# Patient Record
Sex: Male | Born: 1960
Health system: Southern US, Community
[De-identification: ages and names within clinical notes are randomized; demographics above are authoritative.]

## PROBLEM LIST (undated history)

## (undated) DIAGNOSIS — I10 Essential (primary) hypertension: Secondary | ICD-10-CM

## (undated) DIAGNOSIS — N509 Disorder of male genital organs, unspecified: Secondary | ICD-10-CM

## (undated) DIAGNOSIS — Z8739 Personal history of other diseases of the musculoskeletal system and connective tissue: Secondary | ICD-10-CM

## (undated) DIAGNOSIS — E785 Hyperlipidemia, unspecified: Secondary | ICD-10-CM

## (undated) HISTORY — PX: TOTAL HIP ARTHROPLASTY: SHX124

---

## 1997-10-12 ENCOUNTER — Emergency Department (HOSPITAL_COMMUNITY): Admission: EM | Admit: 1997-10-12 | Discharge: 1997-10-12 | Payer: Self-pay | Admitting: Emergency Medicine

## 1998-05-08 HISTORY — PX: KNEE ARTHROSCOPY: SUR90

## 2000-05-08 HISTORY — PX: WRIST GANGLION EXCISION: SHX840

## 2003-05-19 ENCOUNTER — Ambulatory Visit (HOSPITAL_COMMUNITY): Admission: RE | Admit: 2003-05-19 | Discharge: 2003-05-19 | Payer: Self-pay | Admitting: General Surgery

## 2003-05-19 ENCOUNTER — Encounter (INDEPENDENT_AMBULATORY_CARE_PROVIDER_SITE_OTHER): Payer: Self-pay | Admitting: *Deleted

## 2003-05-19 ENCOUNTER — Ambulatory Visit (HOSPITAL_BASED_OUTPATIENT_CLINIC_OR_DEPARTMENT_OTHER): Admission: RE | Admit: 2003-05-19 | Discharge: 2003-05-19 | Payer: Self-pay | Admitting: General Surgery

## 2003-05-19 HISTORY — PX: OTHER SURGICAL HISTORY: SHX169

## 2004-04-19 ENCOUNTER — Ambulatory Visit: Payer: Self-pay | Admitting: Internal Medicine

## 2004-05-17 ENCOUNTER — Ambulatory Visit: Payer: Self-pay | Admitting: Internal Medicine

## 2004-05-18 ENCOUNTER — Ambulatory Visit: Payer: Self-pay | Admitting: Internal Medicine

## 2004-05-26 ENCOUNTER — Ambulatory Visit: Payer: Self-pay | Admitting: Internal Medicine

## 2004-06-13 ENCOUNTER — Ambulatory Visit (HOSPITAL_COMMUNITY): Admission: RE | Admit: 2004-06-13 | Discharge: 2004-06-13 | Payer: Self-pay | Admitting: Internal Medicine

## 2005-01-03 ENCOUNTER — Ambulatory Visit: Payer: Self-pay | Admitting: Internal Medicine

## 2005-06-28 ENCOUNTER — Ambulatory Visit: Payer: Self-pay | Admitting: Internal Medicine

## 2005-11-22 ENCOUNTER — Ambulatory Visit: Payer: Self-pay | Admitting: Internal Medicine

## 2006-03-13 ENCOUNTER — Ambulatory Visit: Payer: Self-pay | Admitting: Internal Medicine

## 2006-03-27 ENCOUNTER — Ambulatory Visit: Payer: Self-pay | Admitting: Internal Medicine

## 2006-08-24 ENCOUNTER — Ambulatory Visit: Payer: Self-pay | Admitting: Internal Medicine

## 2006-08-24 LAB — CONVERTED CEMR LAB
ALT: 39 units/L (ref 0–40)
AST: 25 units/L (ref 0–37)
Basophils Relative: 0.5 % (ref 0.0–1.0)
Bilirubin, Direct: 0.1 mg/dL (ref 0.0–0.3)
CO2: 30 meq/L (ref 19–32)
Calcium: 9.6 mg/dL (ref 8.4–10.5)
Chloride: 110 meq/L (ref 96–112)
Cholesterol: 149 mg/dL (ref 0–200)
Eosinophils Absolute: 0.1 10*3/uL (ref 0.0–0.6)
Eosinophils Relative: 2.4 % (ref 0.0–5.0)
Glucose, Bld: 105 mg/dL — ABNORMAL HIGH (ref 70–99)
HCT: 45.9 % (ref 39.0–52.0)
Lymphocytes Relative: 42.9 % (ref 12.0–46.0)
MCV: 91.1 fL (ref 78.0–100.0)
Neutro Abs: 2.7 10*3/uL (ref 1.4–7.7)
Neutrophils Relative %: 46.3 % (ref 43.0–77.0)
Nitrite: NEGATIVE
PSA: 1.02 ng/mL (ref 0.10–4.00)
Platelets: 204 10*3/uL (ref 150–400)
RBC: 5.04 M/uL (ref 4.22–5.81)
Sodium: 146 meq/L — ABNORMAL HIGH (ref 135–145)
Specific Gravity, Urine: 1.01 (ref 1.000–1.03)
Total Bilirubin: 0.8 mg/dL (ref 0.3–1.2)
Total Protein, Urine: NEGATIVE mg/dL
Total Protein: 7.5 g/dL (ref 6.0–8.3)
Urine Glucose: NEGATIVE mg/dL
Urobilinogen, UA: 0.2 (ref 0.0–1.0)
WBC: 5.7 10*3/uL (ref 4.5–10.5)

## 2006-08-29 ENCOUNTER — Ambulatory Visit: Payer: Self-pay | Admitting: Internal Medicine

## 2006-12-27 DIAGNOSIS — E669 Obesity, unspecified: Secondary | ICD-10-CM | POA: Insufficient documentation

## 2006-12-27 DIAGNOSIS — M545 Low back pain, unspecified: Secondary | ICD-10-CM | POA: Insufficient documentation

## 2006-12-27 DIAGNOSIS — I10 Essential (primary) hypertension: Secondary | ICD-10-CM | POA: Insufficient documentation

## 2006-12-27 DIAGNOSIS — E785 Hyperlipidemia, unspecified: Secondary | ICD-10-CM | POA: Insufficient documentation

## 2006-12-27 DIAGNOSIS — J309 Allergic rhinitis, unspecified: Secondary | ICD-10-CM | POA: Insufficient documentation

## 2006-12-27 DIAGNOSIS — Z872 Personal history of diseases of the skin and subcutaneous tissue: Secondary | ICD-10-CM | POA: Insufficient documentation

## 2006-12-27 DIAGNOSIS — F528 Other sexual dysfunction not due to a substance or known physiological condition: Secondary | ICD-10-CM | POA: Insufficient documentation

## 2007-11-28 ENCOUNTER — Ambulatory Visit: Payer: Self-pay | Admitting: Internal Medicine

## 2007-11-29 LAB — CONVERTED CEMR LAB
ALT: 29 units/L (ref 0–53)
Albumin: 3.9 g/dL (ref 3.5–5.2)
Alkaline Phosphatase: 68 units/L (ref 39–117)
BUN: 11 mg/dL (ref 6–23)
Bilirubin Urine: NEGATIVE
Bilirubin, Direct: 0.1 mg/dL (ref 0.0–0.3)
CO2: 29 meq/L (ref 19–32)
Calcium: 9.4 mg/dL (ref 8.4–10.5)
Eosinophils Relative: 2.3 % (ref 0.0–5.0)
Glucose, Bld: 93 mg/dL (ref 70–99)
Hemoglobin: 15.5 g/dL (ref 13.0–17.0)
Leukocytes, UA: NEGATIVE
Lymphocytes Relative: 46.9 % — ABNORMAL HIGH (ref 12.0–46.0)
Monocytes Relative: 8.8 % (ref 3.0–12.0)
Neutro Abs: 2.1 10*3/uL (ref 1.4–7.7)
Nitrite: NEGATIVE
PSA: 1.25 ng/mL (ref 0.10–4.00)
RBC: 5.05 M/uL (ref 4.22–5.81)
RDW: 13.3 % (ref 11.5–14.6)
Sodium: 140 meq/L (ref 135–145)
Specific Gravity, Urine: 1.01 (ref 1.000–1.03)
Total CHOL/HDL Ratio: 4.6
Total Protein, Urine: NEGATIVE mg/dL
Total Protein: 7.3 g/dL (ref 6.0–8.3)
WBC: 5.1 10*3/uL (ref 4.5–10.5)
pH: 6.5 (ref 5.0–8.0)

## 2007-12-01 ENCOUNTER — Encounter: Payer: Self-pay | Admitting: Internal Medicine

## 2007-12-06 ENCOUNTER — Encounter: Payer: Self-pay | Admitting: Internal Medicine

## 2008-01-16 ENCOUNTER — Ambulatory Visit (HOSPITAL_COMMUNITY): Admission: RE | Admit: 2008-01-16 | Discharge: 2008-01-16 | Payer: Self-pay | Admitting: Orthopedic Surgery

## 2008-06-10 ENCOUNTER — Ambulatory Visit: Payer: Self-pay | Admitting: Internal Medicine

## 2008-06-10 DIAGNOSIS — R1084 Generalized abdominal pain: Secondary | ICD-10-CM | POA: Insufficient documentation

## 2008-11-18 ENCOUNTER — Ambulatory Visit: Payer: Self-pay | Admitting: Internal Medicine

## 2009-02-26 ENCOUNTER — Telehealth: Payer: Self-pay | Admitting: Internal Medicine

## 2009-03-29 ENCOUNTER — Ambulatory Visit: Payer: Self-pay | Admitting: Internal Medicine

## 2009-04-16 ENCOUNTER — Ambulatory Visit: Payer: Self-pay | Admitting: Internal Medicine

## 2009-04-16 DIAGNOSIS — M25569 Pain in unspecified knee: Secondary | ICD-10-CM | POA: Insufficient documentation

## 2009-04-16 DIAGNOSIS — J019 Acute sinusitis, unspecified: Secondary | ICD-10-CM | POA: Insufficient documentation

## 2009-04-26 ENCOUNTER — Ambulatory Visit: Payer: Self-pay | Admitting: Internal Medicine

## 2009-04-26 ENCOUNTER — Telehealth: Payer: Self-pay | Admitting: Internal Medicine

## 2009-04-26 DIAGNOSIS — J069 Acute upper respiratory infection, unspecified: Secondary | ICD-10-CM | POA: Insufficient documentation

## 2010-01-17 ENCOUNTER — Telehealth: Payer: Self-pay | Admitting: Internal Medicine

## 2010-02-09 ENCOUNTER — Ambulatory Visit: Payer: Self-pay | Admitting: Internal Medicine

## 2010-02-09 ENCOUNTER — Encounter: Payer: Self-pay | Admitting: Internal Medicine

## 2010-02-09 LAB — CONVERTED CEMR LAB
ALT: 71 units/L — ABNORMAL HIGH (ref 0–53)
AST: 40 units/L — ABNORMAL HIGH (ref 0–37)
Alkaline Phosphatase: 72 units/L (ref 39–117)
BUN: 14 mg/dL (ref 6–23)
Basophils Relative: 0.8 % (ref 0.0–3.0)
Bilirubin Urine: NEGATIVE
Bilirubin, Direct: 0.1 mg/dL (ref 0.0–0.3)
Chloride: 107 meq/L (ref 96–112)
Creatinine, Ser: 1.1 mg/dL (ref 0.4–1.5)
Eosinophils Relative: 4.4 % (ref 0.0–5.0)
GFR calc non Af Amer: 91.42 mL/min (ref >60)
Hemoglobin, Urine: NEGATIVE
LDL Cholesterol: 95 mg/dL (ref 0–99)
Monocytes Relative: 8.4 % (ref 3.0–12.0)
Neutrophils Relative %: 41.7 % — ABNORMAL LOW (ref 43.0–77.0)
Nitrite: NEGATIVE
Platelets: 200 10*3/uL (ref 150.0–400.0)
Potassium: 4 meq/L (ref 3.5–5.1)
RBC: 4.6 M/uL (ref 4.22–5.81)
TSH: 0.99 microintl units/mL (ref 0.35–5.50)
Total Bilirubin: 0.6 mg/dL (ref 0.3–1.2)
Total CHOL/HDL Ratio: 3
Total Protein, Urine: NEGATIVE mg/dL
Total Protein: 7.4 g/dL (ref 6.0–8.3)
Urobilinogen, UA: 0.2 (ref 0.0–1.0)
VLDL: 13.4 mg/dL (ref 0.0–40.0)
WBC: 6.7 10*3/uL (ref 4.5–10.5)

## 2010-05-28 ENCOUNTER — Encounter: Payer: Self-pay | Admitting: Internal Medicine

## 2010-06-05 LAB — CONVERTED CEMR LAB
BUN: 15 mg/dL (ref 6–23)
Basophils Absolute: 0.1 10*3/uL (ref 0.0–0.1)
Bilirubin, Direct: 0.1 mg/dL (ref 0.0–0.3)
CO2: 31 meq/L (ref 19–32)
Chloride: 108 meq/L (ref 96–112)
Creatinine, Ser: 1 mg/dL (ref 0.4–1.5)
Eosinophils Absolute: 0.2 10*3/uL (ref 0.0–0.7)
HDL: 50.3 mg/dL (ref >39.00)
Hemoglobin, Urine: NEGATIVE
LDL Cholesterol: 76 mg/dL (ref 0–99)
Leukocytes, UA: NEGATIVE
MCHC: 34.2 g/dL (ref 30.0–36.0)
MCV: 90.7 fL (ref 78.0–100.0)
Monocytes Absolute: 0.5 10*3/uL (ref 0.1–1.0)
Neutrophils Relative %: 46 % (ref 43.0–77.0)
Nitrite: NEGATIVE
Platelets: 179 10*3/uL (ref 150.0–400.0)
Total Bilirubin: 0.8 mg/dL (ref 0.3–1.2)
Total CHOL/HDL Ratio: 3
Total Protein, Urine: NEGATIVE mg/dL
Total Protein: 7.3 g/dL (ref 6.0–8.3)
Triglycerides: 49 mg/dL (ref 0.0–149.0)
Urine Glucose: NEGATIVE mg/dL

## 2010-06-07 NOTE — Progress Notes (Signed)
  Phone Note Refill Request Message from:  Fax from Pharmacy on January 17, 2010 2:25 PM  Refills Requested: Medication #1:  CIALIS 20 MG TABS 1 by mouth once daily as needed   Dosage confirmed as above?Dosage Confirmed   Last Refilled: 11/28/2007   [facility] [address] Initial call taken by: Zella Ball Ewing CMA Duncan Dull),  January 17, 2010 2:26 PM    Prescriptions: CIALIS 20 MG TABS (TADALAFIL) 1 by mouth once daily as needed  #5 x 2   Entered by:   Zella Ball Ewing CMA (AAMA)   Authorized by:   Corwin Levins MD   Signed by:   Scharlene Gloss CMA (AAMA) on 01/17/2010   Method used:   [facility]ed to ...       [facility] [place] [phone]* (retail)       [phone] [address]       [place], [place]  [address]       Ph: [phone] or [phone]       Fax: [phone]   RxID:   [id number]

## 2010-06-07 NOTE — Assessment & Plan Note (Signed)
Summary: COUGH/ NOT FEELING WELL/ NO FEVER/NWS   Vital Signs:  Patient profile:   50 year old male Height:      69 inches Weight:      231.75 pounds BMI:     34.35 O2 Sat:      97 % on Room air Temp:     98.4 degrees F oral Pulse rate:   74 / minute BP sitting:   128 / 80  (left arm) Cuff size:   large  Vitals Entered By: Zella Ball Ewing CMA (AAMA) (February 09, 2010 8:33 AM)  O2 Flow:  Room air  CC: Cough, congestion/RE, wellness   CC:  Cough, congestion/RE, and wellness.  History of Present Illness: here for wellness, incidetnly with URI symtpoms improved yest and today with zyrtec D; without fever, ST, cough and Pt denies CP, worsening sob, doe, wheezing, orthopnea, pnd, worsening LE edema, palps, dizziness or syncope  Pt denies new neuro symptoms such as headache, facial or extremity weakness  Pt denies polydipsia, polyuria.  Overall good compliance with meds, trying to follow low chol diet, wt stable, little excercise however No fever, wt loss, night sweats, loss of appetite or other constitutional symptoms   Preventive Screening-Counseling & Management      Drug Use:  no.    Problems Prior to Update: 1)  Uri  (ICD-465.9) 2)  [place] Peers  (ICD-465.9) 3)  Knee Pain, Right, Acute  (ICD-719.46) 4)  Sinusitis- Acute-nos  (ICD-461.9) 5)  Abdominal Pain, Generalized  (ICD-789.07) 6)  Preventive Health Care  (ICD-V70.0) 7)  Low Back Pain  (ICD-724.2) 8)  Erectile Dysfunction  (ICD-302.72) 9)  Obesity  (ICD-278.00) 10)  Hypertension  (ICD-401.9) 11)  Hyperlipidemia  (ICD-272.4) 12)  Allergic Rhinitis  (ICD-477.9) 13)  Ganglion Cyst, Hx of  (ICD-V13.3)  Medications Prior to Update: 1)  Amlodipine Besylate 5 Mg Tabs (Amlodipine Besylate) .... Take 1 Tablet By Mouth Once A Day 2)  Cialis 20 Mg Tabs (Tadalafil) .Marland Kitchen.. 1 By Mouth Once Daily As Needed 3)  Lipitor 80 Mg Tabs (Atorvastatin Calcium) .... Take 1/2 Tablet By Mouth Once A Day 4)  Lisinopril 40 Mg Tabs (Lisinopril) .Marland Kitchen.. 1 By Mouth  Once Daily 5)  Adult Aspirin Ec Low Strength 81 Mg  Tbec (Aspirin) .Marland Kitchen.. 1 By Mouth Once Daily 6)  Hydrocodone-Homatropine 5-1.5 Mg/49ml Syrp (Hydrocodone-Homatropine) .Marland Kitchen.. 1 Tsp By Mouth Q 6 Hrs As Needed Cough 7)  Prednisone 10 Mg Tabs (Prednisone) .... 3po Qd For 3days, Then 2po Qd For 3days, Then 1po Qd For 3days, Then Stop 8)  Hydrochlorothiazide 12.5 Mg Caps (Hydrochlorothiazide) .Marland Kitchen.. 1 By Mouth Once Daily 9)  Nabumetone 750 Mg Tabs (Nabumetone) .Marland Kitchen.. 1 By Mouth Two Times A Day As Needed 10)  Fluticasone Propionate 50 Mcg/act Susp (Fluticasone Propionate) .... 2 Spray.side Once Daily 11)  Cetirizine Hcl 10 Mg Tabs (Cetirizine Hcl) .Marland Kitchen.. 1 By Mouth Once Daily 12)  Lipitor 40 Mg Tabs (Atorvastatin Calcium) .Marland Kitchen.. 1 By Mouth Once Daily  Current Medications (verified): 1)  Amlodipine Besylate 5 Mg Tabs (Amlodipine Besylate) .... Take 1 Tablet By Mouth Once A Day 2)  Levitra 20 Mg Tabs (Vardenafil Hcl) .Marland Kitchen.. 1po Once Daily  As Needed 3)  Lipitor 80 Mg Tabs (Atorvastatin Calcium) .... Take 1/2 Tablet By Mouth Once A Day 4)  Lisinopril 40 Mg Tabs (Lisinopril) .Marland Kitchen.. 1 By Mouth Once Daily 5)  Adult Aspirin Ec Low Strength 81 Mg  Tbec (Aspirin) .Marland Kitchen.. 1 By Mouth Once Daily 6)  Fluticasone Propionate 50  Mcg/act Susp (Fluticasone Propionate) .... 2 Spray.side Once Daily 7)  Lipitor 40 Mg Tabs (Atorvastatin Calcium) .Marland Kitchen.. 1 By Mouth Once Daily  Allergies (verified): No Known Drug Allergies  Past History:  Past Medical History: Last updated: 12/06/2007 Allergic rhinitis Hyperlipidemia Hypertension Obesity Erectile Dysfunction Low back pain c-spine djd idiopathic hypertrophic subaortic stenosis - dr wall/card  Past Surgical History: Last updated: 12/06/2007 L knee Surgery L Wrist  Ganglion Cyst s/p c-spine fusion 4/03 after prior c-spine surgury 1992  Family History: Last updated: 12/06/2007 sister with breast cancer sister with non-colon cancer (not sure what  type) DM HTN arthritis allergies  Social History: Last updated: 02/09/2010 Married no biological children/2 step sons work - makes medical kits with [employer] Former Smoker Alcohol use-yes Drug use-no  Risk Factors: Smoking Status: quit (11/28/2007)  Social History: Reviewed history from 11/18/2008 and no changes required. Married no biological children/2 step sons work - Loss adjuster, chartered with [employer] Former Smoker Alcohol use-yes Drug use-no Drug Use:  no  Review of Systems  The patient denies anorexia, fever, vision loss, decreased hearing, hoarseness, chest pain, syncope, dyspnea on exertion, peripheral edema, prolonged cough, headaches, hemoptysis, abdominal pain, melena, hematochezia, severe indigestion/heartburn, hematuria, muscle weakness, suspicious skin lesions, transient blindness, difficulty walking, depression, unusual weight change, abnormal bleeding, enlarged lymph nodes, and angioedema.         all otherwise negative per pt -    Physical Exam  General:  alert and overweight-appearing.   Head:  normocephalic and atraumatic.   Eyes:  vision grossly intact, pupils equal, and pupils round.   Ears:  bilat tm's mild red, canals ok Nose:  nasal dischargemucosal pallor and mucosal erythema.   Mouth:  pharyngeal erythema and fair dentition.   Neck:  supple and no masses.   Lungs:  normal respiratory effort and normal breath sounds.   Heart:  normal rate and regular rhythm.   Abdomen:  soft, non-tender, and normal bowel sounds.   Msk:  no joint tenderness and no joint swelling.  except for mild sweling and tenderness to medial right knee over the joint line and just below Extremities:  no edema, no erythema  Neurologic:  cranial nerves II-XII intact and strength normal in all extremities.   Skin:  color normal and no rashes.   Psych:  not anxious appearing and not depressed appearing.     Impression & Recommendations:  Problem # 1:   Preventive Health Care (ICD-V70.0)  Overall doing well, age appropriate education and counseling updated and referral for appropriate preventive services done unless declined, immunizations up to date or declined, diet counseling done if overweight, urged to quit smoking if smokes , most recent labs reviewed and current ordered if appropriate, ecg reviewed or declined (interpretation per ECG scanned in the EMR if done); information regarding Medicare Prevention requirements given if appropriate; speciality referrals updated as appropriate   Orders: EKG w/ Interpretation (93000) TLB-BMP (Basic Metabolic Panel-BMET) (80048-METABOL) TLB-CBC Platelet - w/Differential (85025-CBCD) TLB-Hepatic/Liver Function Pnl (80076-HEPATIC) TLB-PSA (Prostate Specific Antigen) (84153-PSA) TLB-Lipid Panel (80061-LIPID) TLB-TSH (Thyroid Stimulating Hormone) (84443-TSH) TLB-Udip ONLY (81003-UDIP)  Problem # 2:  URI (ICD-465.9)  The following medications were removed from the medication list:    Hydrocodone-homatropine 5-1.5 Mg/39ml Syrp (Hydrocodone-homatropine) .Marland Kitchen... 1 tsp by mouth q 6 hrs as needed cough    Nabumetone 750 Mg Tabs (Nabumetone) .Marland Kitchen... 1 by mouth two times a day as needed    Cetirizine Hcl 10 Mg Tabs (Cetirizine hcl) .Marland Kitchen... 1 by mouth once  daily His updated medication list for this problem includes:    Adult Aspirin Ec Low Strength 81 Mg Tbec (Aspirin) .Marland Kitchen... 1 by mouth once daily mild, improving, prob viral, ok for zyrtec d as needed symptoms  Problem # 3:  ERECTILE DYSFUNCTION (ICD-302.72)  His updated medication list for this problem includes:    Levitra 20 Mg Tabs (Vardenafil hcl) .Marland Kitchen... 1po once daily  as needed ok to change to levitra due to cost  Complete Medication List: 1)  Amlodipine Besylate 5 Mg Tabs (Amlodipine besylate) .... Take 1 tablet by mouth once a day 2)  Levitra 20 Mg Tabs (Vardenafil hcl) .Marland Kitchen.. 1po once daily  as needed 3)  Lipitor 80 Mg Tabs (Atorvastatin calcium) ....  Take 1/2 tablet by mouth once a day 4)  Lisinopril 40 Mg Tabs (Lisinopril) .Marland Kitchen.. 1 by mouth once daily 5)  Adult Aspirin Ec Low Strength 81 Mg Tbec (Aspirin) .Marland Kitchen.. 1 by mouth once daily 6)  Fluticasone Propionate 50 Mcg/act Susp (Fluticasone propionate) .... 2 spray.side once daily 7)  Lipitor 40 Mg Tabs (Atorvastatin calcium) .Marland Kitchen.. 1 by mouth once daily  Other Orders: Admin 1st Vaccine ([address]) Flu Vaccine 15yrs + [phone])   Patient Instructions: 1)  please call Dr Kenna Gilbert office to ask when you are due for the next colonoscopy 2)  you had the flu shot today 3)  [facility] 4)  You are given the written rx for levitra that is $9 per pill at [facility] or [facility] 5)  Please go to the Lab in the basement for your blood and/or urine tests today 6)  Please call the number on the [facility] Card for results of your testing 7)  Your EKG was good today 8)  Please schedule a follow-up appointment in 1 year or sooner if needed Prescriptions: LIPITOR 40 MG TABS (ATORVASTATIN CALCIUM) 1 by mouth once daily  #90 x 3   Entered and Authorized by:   Corwin Levins MD   Signed by:   Corwin Levins MD on 02/09/2010   Method used:   Electronically to        [facility] [phone]* (retail)       [address]       [place], [place]  [address]       Ph: [phone] or [phone]       Fax: [phone]   RxID:   [id number] HYDROCHLOROTHIAZIDE 12.5 MG CAPS (HYDROCHLOROTHIAZIDE) 1 by mouth once daily  #90 x 3   Entered and Authorized by:   Corwin Levins MD   Signed by:   Corwin Levins MD on 02/09/2010   Method used:   Electronically to        [facility] [phone]* (retail)       [address]       [place], [place]  [address]       Ph: [phone] or [phone]       Fax: [phone]   RxID:   [id number] LISINOPRIL 40 MG TABS (LISINOPRIL) 1 by mouth once daily  #90 x 3   Entered and Authorized by:   Corwin Levins MD   Signed by:   Corwin Levins MD on 02/09/2010   Method used:    [facility] to        [facility] [phone]* (retail)       [address] [facility]  [place], [place]  [address]       Ph: [phone] or [phone]       Fax: [phone]   RxID:   [id number] LIPITOR 80 MG TABS (ATORVASTATIN CALCIUM) Take 1/2 tablet by mouth once a day  #90 x 3   Entered and Authorized by:   Corwin Levins MD   Signed by:   Corwin Levins MD on 02/09/2010   Method used:   [facility]ronically to        [facility] [phone]* (retail)       [address], [place]  [address]       Ph: [phone] or [phone]       Fax: [phone]   RxID:   [id number] AMLODIPINE BESYLATE 5 MG TABS (AMLODIPINE BESYLATE) Take 1 tablet by mouth once a day  #90 x 3   Entered and Authorized by:   Corwin Levins MD   Signed by:   Corwin Levins MD on 02/09/2010   Method used:   [facility]ronically to        [facility] [phone]* (retail)       [address], [place]  [address]       Ph: [phone] or [phone]       Fax: [phone]   RxID:   [id number] LEVITRA 20 MG TABS (VARDENAFIL HCL) 1po once daily  as needed  #5 x 11   Entered and Authorized by:   Corwin Levins MD   Signed by:   Corwin Levins MD on 02/09/2010   Method used:   Print then Give to Patient   RxID:   [id number]  Flu Vaccine Consent Questions     Do you have a history of severe allergic reactions to this vaccine? no    Any prior history of allergic reactions to egg and/or gelatin? no    Do you have a sensitivity to the preservative Thimersol? no    Do you have a past history of Guillan-Barre Syndrome? no    Do you currently have an acute febrile illness? no    Have you ever had a severe reaction to latex? no    Vaccine information given and explained to patient? yes    Are you currently pregnant? no    Lot Number:[id number]   Exp Date:11/05/2010   Site Given  Left Deltoid IMlbflu

## 2010-07-27 ENCOUNTER — Other Ambulatory Visit: Payer: Self-pay | Admitting: Internal Medicine

## 2010-07-27 NOTE — Telephone Encounter (Signed)
To robin   

## 2010-08-05 ENCOUNTER — Encounter: Payer: Self-pay | Admitting: Internal Medicine

## 2010-08-05 ENCOUNTER — Ambulatory Visit (INDEPENDENT_AMBULATORY_CARE_PROVIDER_SITE_OTHER): Payer: BC Managed Care – PPO | Admitting: Internal Medicine

## 2010-08-05 VITALS — BP 124/82 | HR 82 | Temp 98.2°F | Ht 68.0 in | Wt 228.0 lb

## 2010-08-05 DIAGNOSIS — Z0001 Encounter for general adult medical examination with abnormal findings: Secondary | ICD-10-CM | POA: Insufficient documentation

## 2010-08-05 DIAGNOSIS — J309 Allergic rhinitis, unspecified: Secondary | ICD-10-CM

## 2010-08-05 DIAGNOSIS — Z1211 Encounter for screening for malignant neoplasm of colon: Secondary | ICD-10-CM | POA: Insufficient documentation

## 2010-08-05 DIAGNOSIS — Z0000 Encounter for general adult medical examination without abnormal findings: Secondary | ICD-10-CM

## 2010-08-05 DIAGNOSIS — J069 Acute upper respiratory infection, unspecified: Secondary | ICD-10-CM

## 2010-08-05 DIAGNOSIS — I10 Essential (primary) hypertension: Secondary | ICD-10-CM

## 2010-08-05 MED ORDER — HYDROCODONE-HOMATROPINE 5-1.5 MG/5ML PO SYRP: 5.0000 mL | ORAL_SOLUTION | Freq: Four times a day (QID) | ORAL | Status: AC | PRN

## 2010-08-05 MED ORDER — AZITHROMYCIN 250 MG PO TABS: ORAL_TABLET | ORAL | Status: DC

## 2010-08-05 NOTE — Assessment & Plan Note (Signed)
Mild to mod, for zpak x 1, f/u any worsening symptoms

## 2010-08-05 NOTE — Progress Notes (Signed)
Subjective:    Patient ID: Chad Murphy, male    DOB: [birth year], 50 y.o.   MRN: [id number]  HPI   Here with 3 days acute onset fever, facial pain, pressure, general weakness and malaise, and greenish d/c, with slight ST, but little to no cough and Pt denies chest pain, increased sob or doe,  orthopnea, PND, increased LE swelling, palpitations, dizziness or syncope, but has had some wheezing with chest congestion, all on top of worsening nasal alleryg type symtpoms over the past month, with clearish congesion, no fever, pain, or cough or wheezing.   Pt denies new neurological symptoms such as new headache, or facial or extremity weakness or numbness   Pt denies polydipsia, polyuria.  Pt states overall good compliance with meds, trying to follow lower cholesterol, diabetic diet, wt overall stable but little exercise however.     Pt denies  wt loss, night sweats, loss of appetite, or other constitutional symptoms.  Overall good compliance with treatment, and good medicine tolerability.  Past Medical History  Diagnosis Date  . HYPERLIPIDEMIA 12/27/2006  . OBESITY 12/27/2006  . ERECTILE DYSFUNCTION 12/27/2006  . HYPERTENSION 12/27/2006  . SINUSITIS- ACUTE-NOS 04/16/2009  . URI 04/26/2009  . ALLERGIC RHINITIS 12/27/2006  . KNEE PAIN, RIGHT, ACUTE 04/16/2009  . LOW BACK PAIN 12/27/2006  . Abdominal pain, generalized 06/10/2008  . GANGLION CYST, HX OF 12/27/2006  . Idiopathic hypertrophic subaortic stenosis     Dr. Daleen Squibb, Cardiology   Past Surgical History  Procedure Date  . Left knee surgury   . Left wrist ganglion cyst   . S/p c-spine fusion 08/2001    after prior c-spine surgury 1992    reports that he has quit smoking. He does not have any smokeless tobacco history on file. He reports that he does not drink alcohol or use illicit drugs. family history includes Allergies in his other; Arthritis in his other; Cancer in his sister; Diabetes in his other; and Hypertension in his other. No Known  Allergies  Current Outpatient Prescriptions on File Prior to Visit  Medication Sig Dispense Refill  . amLODipine (NORVASC) 5 MG tablet Take 5 mg by mouth daily.        Marland Kitchen aspirin 81 MG EC tablet Take 81 mg by mouth daily.        Marland Kitchen atorvastatin (LIPITOR) 40 MG tablet Take 40 mg by mouth daily.        . fluticasone (FLONASE) 50 MCG/ACT nasal spray 2 sprays by Nasal route daily.        Marland Kitchen lisinopril (PRINIVIL,ZESTRIL) 40 MG tablet TAKE 1 TABLET EVERY DAY  30 tablet  8  . vardenafil (LEVITRA) 20 MG tablet Take 20 mg by mouth daily as needed.        Marland Kitchen atorvastatin (LIPITOR) 80 MG tablet Take 80 mg by mouth. Take 1/2 tablet by mouth once daily         Review of Systems Review of Systems  Constitutional: Negative for diaphoresis and unexpected weight change.  HENT: Negative for drooling and tinnitus.   Eyes: Negative for photophobia and visual disturbance.  Respiratory: Negative for choking and stridor.   Gastrointestinal: Negative for vomiting and blood in stool.  Genitourinary: Negative for hematuria and decreased urine volume.  Musculoskeletal: Negative for gait problem.  Skin: Negative for color change and wound.  Neurological: Negative for tremors and numbness.  Psychiatric/Behavioral: Negative for decreased concentration. The patient is not hyperactive.       Objective:   Physical  Exam BP 124/82  Pulse 82  Temp(Src) 98.2 F (36.8 C) (Oral)  Ht 5\' 8"  (1.727 m)  Wt 228 lb (103.42 kg)  BMI 34.67 kg/m2  SpO2 97% Physical Exam  VS noted Constitutional: Pt appears well-developed and well-nourished.  HENT: Head: Normocephalic.  Right Ear: External ear normal. Bilat T's severe red, sinus nontender, pharynx marked erythema Left Ear: External ear normal.  Bilat submand LA noted Eyes: Conjunctivae and EOM are normal. Pupils are equal, round, and reactive to light.  Neck: Normal range of motion. Neck supple.  Cardiovascular: Normal rate and regular rhythm.   Pulmonary/Chest: Effort  normal and breath sounds normal.  Abd:  Soft, NT, non-distended, + BS Neurological: Pt is alert. No cranial nerve deficit.  Skin: Skin is warm. No erythema.  No edema Psychiatric: Pt behavior is normal. Thought content normal.          Assessment & Plan:

## 2010-08-05 NOTE — Patient Instructions (Signed)
Take all new medications as prescribed Continue all other medications as before Please return in Oct 2012 with Lab testing done 3-5 days before

## 2010-08-05 NOTE — Assessment & Plan Note (Signed)
Mild , for OTC allegra prn, declines flonase today

## 2010-08-05 NOTE — Assessment & Plan Note (Signed)
stable overall by hx and exam, most recent lab reviewed with pt, and pt to continue medical treatment as before  BP Readings from Last 3 Encounters:  08/05/10 124/82  02/09/10 128/80  04/26/09 132/86    Lab Results  Component Value Date   WBC 6.7 02/09/2010   HGB 14.3 02/09/2010   HGB NEGATIVE 02/09/2010   HCT 42.4 02/09/2010   PLT 200.0 02/09/2010   CHOL 160 02/09/2010   TRIG 67.0 02/09/2010   HDL 52.00 02/09/2010   LDLDIRECT 144.5 11/28/2007   ALT 71* 02/09/2010   AST 40* 02/09/2010   NA 145 02/09/2010   K 4.0 02/09/2010   CL 107 02/09/2010   CREATININE 1.1 02/09/2010   BUN 14 02/09/2010   CO2 31 02/09/2010   TSH 0.99 02/09/2010   PSA 0.93 02/09/2010

## 2010-09-23 NOTE — Op Note (Signed)
NAME:  Chad Murphy, Chad Murphy NO.:  [id number]   MEDICAL RECORD NO.:  [id number]                   PATIENT TYPE:  AMB   LOCATION:  [facility]                                  FACILITY:  [facility]   PHYSICIAN:  Anselm Pancoast. Zachery Dakins, M.D.          DATE OF BIRTH:  1960-05-30   DATE OF PROCEDURE:  05/19/2003  DATE OF DISCHARGE:                                 OPERATIVE REPORT   PREOPERATIVE DIAGNOSIS:  Previously infected epidermoid cyst x2 abdominal  wall.   PROCEDURE:  Excision of epidermoid cyst, previously infected, greater than 4  cm.   ANESTHESIA:  Local.   INDICATIONS FOR PROCEDURE:  The patient is a 50 year old black male that I  first saw about three months ago with an infected cyst, the patient thought  possibly a spider bite of the abdomen, that he had a little knot there  previously, but then it became inflamed.  He saw Penni Bombard, M.D., who  referred him to [place] and I drained the area in the office.  It appeared that  there were actually two epidermoid cysts close together and the second one  required lancing or draining in the office.  Approximately three weeks later  after the infection originally appeared to be subsiding and then recurred.  Since then, he has had warm soaks and kind of a chronic scabbed area that  was too large to excise in the office and we schedule it at this time.  This  morning, he started taking the p.o. Keflex and comes in.   DESCRIPTION OF PROCEDURE:  The area around the area was prepped with  Betadine solution and then I anesthetized the area with about 20 mL of  Xylocaine with Adrenaline.  The actual area was kind of obliquely excised.  Ellipse of the skin around the previously open areas, and then undermining,  kind of going superior, inferior, medially, and laterally to excise all of  the fat that looked like little areas of fat within the area.  The bleeders  most were cauterized with the regular cautery and others were  sutured with 3-  0 chromic and I kind of partially closed the area medially and laterally  with 3-0 chromic and then the skin was closed with interrupted sutures of 3-  0 nylon alternating mattress sutures and simple sutures.  The little defect  in the middle, I will see him back in the office in approximately four days  in case I have to open the wound.  But hopefully this will heal without  having a recurrent infection.  He has enough antibiotics for approximately  three days and I will renew if I need to after I see him in the office.  The  patient tolerated the procedure nicely and originally it was kind of  difficult getting it anesthetized, but after the medicine Xylocaine and a  little time had gone by, the area was comfortable and it was excised and  also using the cautery.  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                                               Anselm Pancoast. Zachery Dakins, M.D.    WJW/MEDQ  D:  05/19/2003  T:  05/19/2003  Job:  [id number]

## 2011-03-01 ENCOUNTER — Encounter: Payer: BC Managed Care – PPO | Admitting: Internal Medicine

## 2011-03-14 ENCOUNTER — Other Ambulatory Visit: Payer: Self-pay | Admitting: Internal Medicine

## 2011-03-15 ENCOUNTER — Other Ambulatory Visit: Payer: Self-pay

## 2011-03-15 MED ORDER — TADALAFIL 20 MG PO TABS: 20.0000 mg | ORAL_TABLET | Freq: Every day | ORAL | Status: DC | PRN

## 2011-03-15 NOTE — Telephone Encounter (Signed)
Patient is requesting a refill on Cialis did not see on medication list, please advise

## 2011-03-15 NOTE — Telephone Encounter (Signed)
Called the patient informed prescription requested is at the pharmacy.

## 2011-03-15 NOTE — Telephone Encounter (Signed)
Done per emr 

## 2011-04-16 ENCOUNTER — Other Ambulatory Visit: Payer: Self-pay | Admitting: Internal Medicine

## 2011-04-18 ENCOUNTER — Ambulatory Visit (INDEPENDENT_AMBULATORY_CARE_PROVIDER_SITE_OTHER): Payer: BC Managed Care – PPO | Admitting: Internal Medicine

## 2011-04-18 ENCOUNTER — Encounter: Payer: Self-pay | Admitting: Internal Medicine

## 2011-04-18 ENCOUNTER — Other Ambulatory Visit (INDEPENDENT_AMBULATORY_CARE_PROVIDER_SITE_OTHER): Payer: BC Managed Care – PPO

## 2011-04-18 VITALS — BP 134/94 | HR 69 | Temp 98.0°F | Ht 68.5 in | Wt 237.0 lb

## 2011-04-18 DIAGNOSIS — Z23 Encounter for immunization: Secondary | ICD-10-CM

## 2011-04-18 DIAGNOSIS — Z0000 Encounter for general adult medical examination without abnormal findings: Secondary | ICD-10-CM

## 2011-04-18 DIAGNOSIS — M25579 Pain in unspecified ankle and joints of unspecified foot: Secondary | ICD-10-CM

## 2011-04-18 LAB — CBC WITH DIFFERENTIAL/PLATELET
Basophils Relative: 0.8 % (ref 0.0–3.0)
Eosinophils Relative: 2.9 % (ref 0.0–5.0)
HCT: 44.5 % (ref 39.0–52.0)
Lymphs Abs: 3.5 10*3/uL (ref 0.7–4.0)
MCHC: 33.7 g/dL (ref 30.0–36.0)
MCV: 91.5 fl (ref 78.0–100.0)
Monocytes Absolute: 0.6 10*3/uL (ref 0.1–1.0)
Platelets: 191 10*3/uL (ref 150.0–400.0)
WBC: 7.6 10*3/uL (ref 4.5–10.5)

## 2011-04-18 LAB — BASIC METABOLIC PANEL
BUN: 14 mg/dL (ref 6–23)
CO2: 29 mEq/L (ref 19–32)
Calcium: 9.3 mg/dL (ref 8.4–10.5)
Chloride: 108 mEq/L (ref 96–112)
Creatinine, Ser: 1 mg/dL (ref 0.4–1.5)
Glucose, Bld: 95 mg/dL (ref 70–99)

## 2011-04-18 LAB — LIPID PANEL
LDL Cholesterol: 84 mg/dL (ref 0–99)
Total CHOL/HDL Ratio: 3
Triglycerides: 42 mg/dL (ref 0.0–149.0)

## 2011-04-18 LAB — PSA: PSA: 1.2 ng/mL (ref 0.10–4.00)

## 2011-04-18 LAB — HEPATIC FUNCTION PANEL
AST: 24 U/L (ref 0–37)
Albumin: 4.3 g/dL (ref 3.5–5.2)
Alkaline Phosphatase: 72 U/L (ref 39–117)
Total Bilirubin: 0.4 mg/dL (ref 0.3–1.2)

## 2011-04-18 LAB — URINALYSIS, ROUTINE W REFLEX MICROSCOPIC
Bilirubin Urine: NEGATIVE
Hgb urine dipstick: NEGATIVE
Ketones, ur: NEGATIVE
Leukocytes, UA: NEGATIVE
pH: 6 (ref 5.0–8.0)

## 2011-04-18 MED ORDER — NABUMETONE 500 MG PO TABS: 500.0000 mg | ORAL_TABLET | Freq: Two times a day (BID) | ORAL | Status: AC

## 2011-04-18 NOTE — Progress Notes (Signed)
Subjective:    Patient ID: Chad Murphy, male    DOB: 1960/08/21, 50 y.o.   MRN: [id number]  HPI  Here for wellness and f/u;  Overall doing ok;  Pt denies CP, worsening SOB, DOE, wheezing, orthopnea, PND, worsening LE edema, palpitations, dizziness or syncope.  Pt denies neurological change such as new Headache, facial or extremity weakness.  Pt denies polydipsia, polyuria, or low sugar symptoms. Pt states overall good compliance with treatment and medications, good tolerability, and trying to follow lower cholesterol diet.  Pt denies worsening depressive symptoms, suicidal ideation or panic. No fever, wt loss, night sweats, loss of appetite, or other constitutional symptoms.  Pt states good ability with ADL's, low fall risk, home safety reviewed and adequate, no significant changes in hearing or vision, and occasionally active with exercise.  Here with 4 wks onset right ankle pain and swelling, worse to the back of the heel/achilles, but swlling involved the whole ankle; he remembers a sligh twist to the ankle but no other significant injury, fever, redness, hx of gout. Tends to walk quite a bit working third shift for [employer].  Past Medical History  Diagnosis Date  . HYPERLIPIDEMIA 12/27/2006  . OBESITY 12/27/2006  . ERECTILE DYSFUNCTION 12/27/2006  . HYPERTENSION 12/27/2006  . SINUSITIS- ACUTE-NOS 04/16/2009  . URI 04/26/2009  . ALLERGIC RHINITIS 12/27/2006  . KNEE PAIN, RIGHT, ACUTE 04/16/2009  . LOW BACK PAIN 12/27/2006  . Abdominal pain, generalized 06/10/2008  . GANGLION CYST, HX OF 12/27/2006  . Idiopathic hypertrophic subaortic stenosis     Dr. Daleen Squibb, Cardiology   Past Surgical History  Procedure Date  . Left knee surgury   . Left wrist ganglion cyst   . S/p c-spine fusion 08/2001    after prior c-spine surgury 1992    reports that he has quit smoking. He does not have any smokeless tobacco history on file. He reports that he does not drink alcohol or use illicit  drugs. family history includes Allergies in his other; Arthritis in his other; Cancer in his sister; Diabetes in his other; and Hypertension in his other. No Known Allergies Current Outpatient Prescriptions on File Prior to Visit  Medication Sig Dispense Refill  . amLODipine (NORVASC) 5 MG tablet TAKE 1 TABLET BY MOUTH EVERY DAY  30 tablet  4  . aspirin 81 MG EC tablet Take 81 mg by mouth daily.        Marland Kitchen atorvastatin (LIPITOR) 40 MG tablet Take 40 mg by mouth daily.        . fluticasone (FLONASE) 50 MCG/ACT nasal spray 2 sprays by Nasal route daily.        Marland Kitchen lisinopril (PRINIVIL,ZESTRIL) 40 MG tablet TAKE 1 TABLET EVERY DAY  30 tablet  8   Review of Systems Review of Systems  Constitutional: Negative for diaphoresis, activity change, appetite change and unexpected weight change.  HENT: Negative for hearing loss, ear pain, facial swelling, mouth sores and neck stiffness.   Eyes: Negative for pain, redness and visual disturbance.  Respiratory: Negative for shortness of breath and wheezing.   Cardiovascular: Negative for chest pain and palpitations.  Gastrointestinal: Negative for diarrhea, blood in stool, abdominal distention and rectal pain.  Genitourinary: Negative for hematuria, flank pain and decreased urine volume.  Musculoskeletal: Negative for myalgias and joint swelling.  Skin: Negative for color change and wound.  Neurological: Negative for syncope and numbness.  Hematological: Negative for adenopathy.  Psychiatric/Behavioral: Negative for hallucinations, self-injury, decreased concentration and agitation.  Objective:   Physical Exam BP 134/94  Pulse 69  Temp(Src) 98 F (36.7 C) (Oral)  Ht 5' 8.5" (1.74 m)  Wt 237 lb (107.502 kg)  BMI 35.51 kg/m2  SpO2 98% Physical Exam  VS noted Constitutional: Pt is oriented to person, place, and time. Appears well-developed and well-nourished.  HENT:  Head: Normocephalic and atraumatic.  Right Ear: External ear normal.  Left  Ear: External ear normal.  Nose: Nose normal.  Mouth/Throat: Oropharynx is clear and moist.  Eyes: Conjunctivae and EOM are normal. Pupils are equal, round, and reactive to light.  Neck: Normal range of motion. Neck supple. No JVD present. No tracheal deviation present.  Cardiovascular: Normal rate, regular rhythm, normal heart sounds and intact distal pulses.   Pulmonary/Chest: Effort normal and breath sounds normal.  Abdominal: Soft. Bowel sounds are normal. There is no tenderness.  Musculoskeletal: Normal range of motion. Exhibits no edema.  Lymphadenopathy:  Has no cervical adenopathy.  Neurological: Pt is alert and oriented to person, place, and time. Pt has normal reflexes. No cranial nerve deficit.  Skin: Skin is warm and dry. No rash noted.  Psychiatric:  Has  normal mood and affect. Behavior is normal.     Assessment & Plan:

## 2011-04-18 NOTE — Assessment & Plan Note (Signed)

## 2011-04-18 NOTE — Patient Instructions (Addendum)
You had the flu shot today Your EKG was ok today Take all new medications as prescribed Continue all other medications as before Please go to LAB in the Basement for the blood and/or urine tests to be done today Please call the phone number [phone] (the PhoneTree System) for results of testing in 2-3 days;  When calling, simply dial the number, and when prompted enter the MRN number above (the Medical Record Number) and the # key, then the message should start. Please see Dr Mann/GI as planned to followup on your colonoscopy Please call if you need the referral to Dr Hewitt/ortho at [facility] orthopedics Please return in 1 year for your yearly visit, or sooner if needed, with Lab testing done 3-5 days before

## 2011-04-23 ENCOUNTER — Encounter: Payer: Self-pay | Admitting: Internal Medicine

## 2011-04-23 DIAGNOSIS — M25571 Pain in right ankle and joints of right foot: Secondary | ICD-10-CM | POA: Insufficient documentation

## 2011-04-23 NOTE — Assessment & Plan Note (Signed)
Mild, for ortho referral when pt wishes, currently mild, declines today

## 2011-05-09 ENCOUNTER — Other Ambulatory Visit: Payer: Self-pay | Admitting: Internal Medicine

## 2011-05-14 ENCOUNTER — Other Ambulatory Visit: Payer: Self-pay | Admitting: Internal Medicine

## 2011-09-22 ENCOUNTER — Other Ambulatory Visit: Payer: Self-pay | Admitting: Internal Medicine

## 2012-02-26 ENCOUNTER — Telehealth: Payer: Self-pay | Admitting: Internal Medicine

## 2012-02-26 NOTE — Telephone Encounter (Signed)
Pt had his physical last Dec 12 .  He wants to be worked in for a physical between Dec. 13 and the end of the year.  Next avail. Slot in early Jan.  OK to work in?

## 2012-02-26 NOTE — Telephone Encounter (Signed)
Ok with me 

## 2012-02-28 ENCOUNTER — Telehealth: Payer: Self-pay

## 2012-02-28 DIAGNOSIS — Z0000 Encounter for general adult medical examination without abnormal findings: Secondary | ICD-10-CM

## 2012-02-28 NOTE — Telephone Encounter (Signed)
Put lab order in. 

## 2012-03-13 ENCOUNTER — Ambulatory Visit (INDEPENDENT_AMBULATORY_CARE_PROVIDER_SITE_OTHER): Payer: BC Managed Care – PPO

## 2012-03-13 DIAGNOSIS — Z23 Encounter for immunization: Secondary | ICD-10-CM

## 2012-03-29 ENCOUNTER — Other Ambulatory Visit: Payer: Self-pay | Admitting: Internal Medicine

## 2012-04-29 ENCOUNTER — Encounter: Payer: BC Managed Care – PPO | Admitting: Internal Medicine

## 2012-04-29 DIAGNOSIS — Z0289 Encounter for other administrative examinations: Secondary | ICD-10-CM

## 2012-05-11 ENCOUNTER — Other Ambulatory Visit: Payer: Self-pay | Admitting: Internal Medicine

## 2012-06-09 ENCOUNTER — Other Ambulatory Visit: Payer: Self-pay | Admitting: Internal Medicine

## 2012-06-19 ENCOUNTER — Other Ambulatory Visit (INDEPENDENT_AMBULATORY_CARE_PROVIDER_SITE_OTHER): Payer: BC Managed Care – PPO

## 2012-06-19 ENCOUNTER — Ambulatory Visit (INDEPENDENT_AMBULATORY_CARE_PROVIDER_SITE_OTHER): Payer: BC Managed Care – PPO | Admitting: Internal Medicine

## 2012-06-19 ENCOUNTER — Encounter: Payer: Self-pay | Admitting: Internal Medicine

## 2012-06-19 VITALS — BP 134/90 | HR 77 | Temp 97.9°F | Ht 68.0 in | Wt 243.4 lb

## 2012-06-19 DIAGNOSIS — M25571 Pain in right ankle and joints of right foot: Secondary | ICD-10-CM

## 2012-06-19 DIAGNOSIS — I10 Essential (primary) hypertension: Secondary | ICD-10-CM

## 2012-06-19 DIAGNOSIS — Z0000 Encounter for general adult medical examination without abnormal findings: Secondary | ICD-10-CM

## 2012-06-19 DIAGNOSIS — M25579 Pain in unspecified ankle and joints of unspecified foot: Secondary | ICD-10-CM

## 2012-06-19 DIAGNOSIS — E049 Nontoxic goiter, unspecified: Secondary | ICD-10-CM

## 2012-06-19 LAB — LIPID PANEL
HDL: 51.4 mg/dL (ref >39.00)
Total CHOL/HDL Ratio: 3

## 2012-06-19 LAB — CBC WITH DIFFERENTIAL/PLATELET
Basophils Absolute: 0.1 10*3/uL (ref 0.0–0.1)
Eosinophils Absolute: 0.2 10*3/uL (ref 0.0–0.7)
Hemoglobin: 15 g/dL (ref 13.0–17.0)
Lymphocytes Relative: 44.6 % (ref 12.0–46.0)
MCHC: 33.4 g/dL (ref 30.0–36.0)
Monocytes Relative: 8.4 % (ref 3.0–12.0)
Neutro Abs: 3.7 10*3/uL (ref 1.4–7.7)
Neutrophils Relative %: 44.1 % (ref 43.0–77.0)
Platelets: 195 10*3/uL (ref 150.0–400.0)
RDW: 14.7 % — ABNORMAL HIGH (ref 11.5–14.6)

## 2012-06-19 LAB — URINALYSIS, ROUTINE W REFLEX MICROSCOPIC
Hgb urine dipstick: NEGATIVE
Leukocytes, UA: NEGATIVE
Specific Gravity, Urine: 1.02 (ref 1.000–1.030)
Urine Glucose: NEGATIVE
Urobilinogen, UA: 0.2 (ref 0.0–1.0)

## 2012-06-19 LAB — HEPATIC FUNCTION PANEL
ALT: 32 U/L (ref 0–53)
AST: 27 U/L (ref 0–37)
Albumin: 4.2 g/dL (ref 3.5–5.2)
Alkaline Phosphatase: 79 U/L (ref 39–117)
Bilirubin, Direct: 0.1 mg/dL (ref 0.0–0.3)
Total Protein: 7.6 g/dL (ref 6.0–8.3)

## 2012-06-19 LAB — BASIC METABOLIC PANEL
CO2: 29 mEq/L (ref 19–32)
Calcium: 9.8 mg/dL (ref 8.4–10.5)
Chloride: 105 mEq/L (ref 96–112)
Glucose, Bld: 82 mg/dL (ref 70–99)
Sodium: 140 mEq/L (ref 135–145)

## 2012-06-19 LAB — TSH: TSH: 1.01 u[IU]/mL (ref 0.35–5.50)

## 2012-06-19 MED ORDER — FLUTICASONE PROPIONATE 50 MCG/ACT NA SUSP: 2.0000 | Freq: Every day | NASAL | Status: DC

## 2012-06-19 MED ORDER — LISINOPRIL 40 MG PO TABS: 40.0000 mg | ORAL_TABLET | Freq: Every day | ORAL | Status: DC

## 2012-06-19 MED ORDER — ATORVASTATIN CALCIUM 40 MG PO TABS: 40.0000 mg | ORAL_TABLET | Freq: Every day | ORAL | Status: DC

## 2012-06-19 MED ORDER — AMLODIPINE BESYLATE 5 MG PO TABS: 5.0000 mg | ORAL_TABLET | Freq: Every day | ORAL | Status: DC

## 2012-06-19 MED ORDER — TADALAFIL 20 MG PO TABS: 20.0000 mg | ORAL_TABLET | Freq: Every day | ORAL | Status: DC | PRN

## 2012-06-19 NOTE — Progress Notes (Addendum)
Subjective:    Patient ID: Chad Murphy, male    DOB: [birth year], 52 y.o.   MRN: [id number]  HPI  Here for wellness and f/u;  Overall doing ok;  Pt denies CP, worsening SOB, DOE, wheezing, orthopnea, PND, worsening LE edema, palpitations, dizziness or syncope.  Pt denies neurological change such as new headache, facial or extremity weakness.  Pt denies polydipsia, polyuria, or low sugar symptoms. Pt states overall good compliance with treatment and medications, good tolerability, and has been trying to follow lower cholesterol diet.  Pt denies worsening depressive symptoms, suicidal ideation or panic. No fever, night sweats, wt loss, loss of appetite, or other constitutional symptoms.  Pt states good ability with ADL's, has low fall risk, home safety reviewed and adequate, no other significant changes in hearing or vision, and only occasionally active with exercise.  Also Has recurring mod to severe right ankle pain and swelling, for 1 yr, worse with standing and walking at work, better with rest, no fever, trauma, hx of gout. Past Medical History  Diagnosis Date  . HYPERLIPIDEMIA 12/27/2006  . OBESITY 12/27/2006  . ERECTILE DYSFUNCTION 12/27/2006  . HYPERTENSION 12/27/2006  . SINUSITIS- ACUTE-NOS 04/16/2009  . URI 04/26/2009  . ALLERGIC RHINITIS 12/27/2006  . KNEE PAIN, RIGHT, ACUTE 04/16/2009  . LOW BACK PAIN 12/27/2006  . Abdominal pain, generalized 06/10/2008  . GANGLION CYST, HX OF 12/27/2006  . Idiopathic hypertrophic subaortic stenosis     Dr. Daleen Squibb, Cardiology   Past Surgical History  Procedure Laterality Date  . Left knee surgury    . Left wrist ganglion cyst    . S/p c-spine fusion  08/2001    after prior c-spine surgury 1992    reports that he has quit smoking. He does not have any smokeless tobacco history on file. He reports that he does not drink alcohol or use illicit drugs. family history includes Allergies in his other; Arthritis in his other; Cancer in his sister; Diabetes  in his other; and Hypertension in his other. No Known Allergies Current Outpatient Prescriptions on File Prior to Visit  Medication Sig Dispense Refill  . aspirin 81 MG EC tablet Take 81 mg by mouth daily.        Marland Kitchen CIALIS 20 MG tablet TAKE 1 TABLET (20 MG TOTAL) BY MOUTH DAILY AS NEEDED FOR ERECTILE DYSFUNCTION.  10 tablet  0   No current facility-administered medications on file prior to visit.   Review of Systems Constitutional: Negative for diaphoresis, activity change, appetite change or unexpected weight change.  HENT: Negative for hearing loss, ear pain, facial swelling, mouth sores and neck stiffness.   Eyes: Negative for pain, redness and visual disturbance.  Respiratory: Negative for shortness of breath and wheezing.   Cardiovascular: Negative for chest pain and palpitations.  Gastrointestinal: Negative for diarrhea, blood in stool, abdominal distention or other pain Genitourinary: Negative for hematuria, flank pain or change in urine volume.  Musculoskeletal: Negative for myalgias and joint swelling.  Skin: Negative for color change and wound.  Neurological: Negative for syncope and numbness. other than noted Hematological: Negative for adenopathy.  Psychiatric/Behavioral: Negative for hallucinations, self-injury, decreased concentration and agitation.      Objective:   Physical Exam BP 134/90  Pulse 77  Temp(Src) 97.9 F (36.6 C) (Oral)  Ht 5\' 8"  (1.727 m)  Wt 243 lb 6 oz (110.394 kg)  BMI 37.01 kg/m2  SpO2 96% VS noted,  Constitutional: Pt is oriented to person, place, and time. Appears well-developed and  well-nourished.  Head: Normocephalic and atraumatic.  Right Ear: External ear normal.  Left Ear: External ear normal.  Nose: Nose normal.  Mouth/Throat: Oropharynx is clear and moist.  Thyroid with bilat nodularity ? > 1 cm, nontender/firm Eyes: Conjunctivae and EOM are normal. Pupils are equal, round, and reactive to light.  Neck: Normal range of motion. Neck  supple. No JVD present. No tracheal deviation present.  Cardiovascular: Normal rate, regular rhythm, normal heart sounds and intact distal pulses.   Pulmonary/Chest: Effort normal and breath sounds normal.  Abdominal: Soft. Bowel sounds are normal. There is no tenderness. No HSM  Musculoskeletal: Normal range of motion. Exhibits no edema.  Lymphadenopathy:  Has no cervical adenopathy.  Neurological: Pt is alert and oriented to person, place, and time. Pt has normal reflexes. No cranial nerve deficit.  Skin: Skin is warm and dry. No rash noted.  Psychiatric:  Has  normal mood and affect. Behavior is normal.  Right ankle without effusion or tenderness    Assessment & Plan:

## 2012-06-19 NOTE — Patient Instructions (Addendum)
Your EKG was OK today You are given the copy of the lab result from 2012 Please continue all other medications as before, and refills have been done if requested (all were done today) Please go to the LAB in the Basement (turn left off the elevator) for the tests to be done today You will be contacted by phone if any changes need to be made immediately.  Otherwise, you will receive a letter about your results with an explanation, but please check with MyChart first. Thank you for enrolling in MyChart. Please follow the instructions below to securely access your online medical record. MyChart allows you to send messages to your doctor, view your test results, renew your prescriptions, schedule appointments, and more. To Log into My Chart online, please go by [employer] or [employer] to [email or web address], or download the MyChart App from the [employer] of [employer] .  Your Username is: [email or web address] (pass deepblue) Please send a practice Message on Mychart later today. You will be contacted regarding the referral for: colonoscopy, and thyroid ultrasound You are otherwise up to date with prevention measures today. Please continue your efforts at being more active, low cholesterol diet, and weight control. You will be contacted regarding the referral for: orthopedic for the right ankle Please return in 1 year for your yearly visit, or sooner if needed, with Lab testing done 3-5 days before

## 2012-06-19 NOTE — Assessment & Plan Note (Addendum)
ECG reviewed as per emr, stable overall by history and exam, recent data reviewed with pt, and pt to continue medical treatment as before,  to f/u any worsening symptoms or concerns BP Readings from Last 3 Encounters:  06/19/12 134/90  04/18/11 134/94  08/05/10 124/82

## 2012-06-20 NOTE — Assessment & Plan Note (Signed)
Unclear etiology, for ortho referral

## 2012-06-20 NOTE — Assessment & Plan Note (Signed)

## 2012-06-21 DIAGNOSIS — E049 Nontoxic goiter, unspecified: Secondary | ICD-10-CM | POA: Insufficient documentation

## 2012-06-21 NOTE — Assessment & Plan Note (Signed)
?   Clinical significance - for thyroid u/s

## 2012-06-21 NOTE — Addendum Note (Signed)
Addended by: Corwin Levins on: 06/21/2012 08:38 PM   Modules accepted: Orders

## 2012-06-25 ENCOUNTER — Telehealth: Payer: Self-pay | Admitting: Internal Medicine

## 2012-06-25 NOTE — Telephone Encounter (Signed)
Caller: Chad Murphy/Patient; Phone: [phone]; Reason for Call: Caller returning call from Dr.  Jonny Murphy recieved 06/24/12.  Pt received info about labs on My Chart.  [name] has an appt scheduled 07/01/12 with imaging.   RN unable to find notes regarding call or info to relay to pt.  Advised caller I would let Dr.  Jonny Murphy know he returned his call.   Caller states it is ok to leave a voicemail.  Pt works 3rd shift.  Best time to reach him is early in the morning.

## 2012-07-01 ENCOUNTER — Ambulatory Visit
Admission: RE | Admit: 2012-07-01 | Discharge: 2012-07-01 | Disposition: A | Discharge status: Home / Self Care | Payer: BC Managed Care – PPO | Source: Ambulatory Visit | Attending: Internal Medicine | Admitting: Internal Medicine

## 2012-07-01 DIAGNOSIS — E049 Nontoxic goiter, unspecified: Secondary | ICD-10-CM

## 2012-07-21 ENCOUNTER — Other Ambulatory Visit: Payer: Self-pay | Admitting: Internal Medicine

## 2012-10-09 ENCOUNTER — Telehealth: Payer: Self-pay

## 2012-10-09 DIAGNOSIS — M25571 Pain in right ankle and joints of right foot: Secondary | ICD-10-CM

## 2012-10-09 NOTE — Telephone Encounter (Signed)
The past was referred to [place] [facility] back in the winter.  He was unable to go to appt. Due to weather and would like to be referred there once again

## 2012-10-09 NOTE — Telephone Encounter (Signed)
Referral done

## 2012-11-28 ENCOUNTER — Other Ambulatory Visit: Payer: Self-pay | Admitting: Internal Medicine

## 2012-12-10 ENCOUNTER — Ambulatory Visit (INDEPENDENT_AMBULATORY_CARE_PROVIDER_SITE_OTHER): Payer: BC Managed Care – PPO | Admitting: Internal Medicine

## 2012-12-10 ENCOUNTER — Encounter: Payer: Self-pay | Admitting: Internal Medicine

## 2012-12-10 VITALS — BP 140/88 | HR 85 | Temp 98.0°F | Ht 69.0 in | Wt 242.0 lb

## 2012-12-10 DIAGNOSIS — N5089 Other specified disorders of the male genital organs: Secondary | ICD-10-CM

## 2012-12-10 DIAGNOSIS — I10 Essential (primary) hypertension: Secondary | ICD-10-CM

## 2012-12-10 DIAGNOSIS — E785 Hyperlipidemia, unspecified: Secondary | ICD-10-CM

## 2012-12-10 DIAGNOSIS — N508 Other specified disorders of male genital organs: Secondary | ICD-10-CM

## 2012-12-10 MED ORDER — TADALAFIL 20 MG PO TABS: ORAL_TABLET | ORAL | Status: DC

## 2012-12-10 NOTE — Assessment & Plan Note (Signed)
appro 1.5 cm large raised granulation tissue type mass - for urology referral, likely needs removed

## 2012-12-10 NOTE — Patient Instructions (Signed)
Please continue all other medications as before, and refills have been done if requested - the cialis Please have the pharmacy call with any other refills you may need. You will be contacted regarding the referral for: urology Please keep your appointments with your specialists as you may have planned Please continue your efforts at being more active, low cholesterol diet, and weight control.  Please remember to sign up for My Chart if you have not done so, as this will be important to you in the future with finding out test results, communicating by private email, and scheduling acute appointments online when needed.  Please return in 6 months, or sooner if needed, with Lab testing done 3-5 days before

## 2012-12-10 NOTE — Assessment & Plan Note (Signed)
stable overall by history and exam, recent data reviewed with pt, and pt to continue medical treatment as before,  to f/u any worsening symptoms or concerns Lab Results  Component Value Date   LDLCALC 79 06/19/2012   To cont meds, diet

## 2012-12-10 NOTE — Assessment & Plan Note (Signed)
stable overall by history and exam, recent data reviewed with pt, and pt to continue medical treatment as before,  to f/u any worsening symptoms or concerns BP Readings from Last 3 Encounters:  12/10/12 140/88  06/19/12 134/90  04/18/11 134/94

## 2012-12-10 NOTE — Progress Notes (Signed)
Subjective:    Patient ID: Chad Murphy, male    DOB: 04/29/1961, 52 y.o.   MRN: [id number]  HPI  Here to f/u; overall doing ok,  Pt denies chest pain, increased sob or doe, wheezing, orthopnea, PND, increased LE swelling, palpitations, dizziness or syncope.  Pt denies polydipsia, polyuria, or low sugar symptoms such as weakness or confusion improved with po intake.  Pt denies new neurological symptoms such as new headache, or facial or extremity weakness or numbness.   Pt states overall good compliance with meds, has been trying to follow lower cholesterol diet, with wt overall stable,  but little exercise however.  Has Recent ankle pain, improved, saw ortho recently.  Also with a skin tag large to right medial thigh, irritated, not coming off.Has a stationary bike that helps for exercise, less ankle pain.Brace helps. Plans to return for f/u.   Past Medical History  Diagnosis Date  . HYPERLIPIDEMIA 12/27/2006  . OBESITY 12/27/2006  . ERECTILE DYSFUNCTION 12/27/2006  . HYPERTENSION 12/27/2006  . SINUSITIS- ACUTE-NOS 04/16/2009  . URI 04/26/2009  . ALLERGIC RHINITIS 12/27/2006  . KNEE PAIN, RIGHT, ACUTE 04/16/2009  . LOW BACK PAIN 12/27/2006  . Abdominal pain, generalized 06/10/2008  . GANGLION CYST, HX OF 12/27/2006  . Idiopathic hypertrophic subaortic stenosis     Dr. Daleen Squibb, Cardiology   Past Surgical History  Procedure Laterality Date  . Left knee surgury    . Left wrist ganglion cyst    . S/p c-spine fusion  08/2001    after prior c-spine surgury 1992    reports that he has quit smoking. He does not have any smokeless tobacco history on file. He reports that he does not drink alcohol or use illicit drugs. family history includes Allergies in his other; Arthritis in his other; Cancer in his sister; Diabetes in his other; and Hypertension in his other. No Known Allergies Current Outpatient Prescriptions on File Prior to Visit  Medication Sig Dispense Refill  . amLODipine (NORVASC) 5 MG  tablet Take 1 tablet (5 mg total) by mouth daily.  90 tablet  3  . aspirin 81 MG EC tablet Take 81 mg by mouth daily.        Marland Kitchen atorvastatin (LIPITOR) 40 MG tablet Take 1 tablet (40 mg total) by mouth daily.  90 tablet  3  . atorvastatin (LIPITOR) 40 MG tablet TAKE 1 TABLET BY MOUTH EVERY DAY  180 tablet  0  . lisinopril (PRINIVIL,ZESTRIL) 40 MG tablet Take 1 tablet (40 mg total) by mouth daily.  90 tablet  3  . tadalafil (CIALIS) 20 MG tablet Take 1 tablet (20 mg total) by mouth daily as needed for erectile dysfunction.  10 tablet  11   No current facility-administered medications on file prior to visit.    Review of Systems  Constitutional: Negative for unexpected weight change, or unusual diaphoresis  HENT: Negative for tinnitus.   Eyes: Negative for photophobia and visual disturbance.  Respiratory: Negative for choking and stridor.   Gastrointestinal: Negative for vomiting and blood in stool.  Genitourinary: Negative for hematuria and decreased urine volume.  Musculoskeletal: Negative for acute joint swelling Skin: Negative for color change and wound.  Neurological: Negative for tremors and numbness other than noted  Psychiatric/Behavioral: Negative for decreased concentration or  hyperactivity.       Objective:   Physical Exam BP 140/88  Pulse 85  Temp(Src) 98 F (36.7 C) (Oral)  Ht 5\' 9"  (1.753 m)  Wt 242 lb (109.77  kg)  BMI 35.72 kg/m2  SpO2 97% VS noted,  Constitutional: Pt appears well-developed and well-nourished.  HENT: Head: NCAT.  Right Ear: External ear normal.  Left Ear: External ear normal.  Eyes: Conjunctivae and EOM are normal. Pupils are equal, round, and reactive to light.  Neck: Normal range of motion. Neck supple.  Cardiovascular: Normal rate and regular rhythm.   Pulmonary/Chest: Effort normal and breath sounds normal.  GU:  Right scrotum with 1.5 cm gramular raised mass, firm, non tender, nondraining Neurological: Pt is alert. Not confused  Skin:  Skin is warm. No erythema.  Psychiatric: Pt behavior is normal. Thought content normal.      Assessment & Plan:

## 2012-12-13 ENCOUNTER — Encounter (HOSPITAL_BASED_OUTPATIENT_CLINIC_OR_DEPARTMENT_OTHER): Payer: Self-pay | Admitting: *Deleted

## 2012-12-13 ENCOUNTER — Other Ambulatory Visit: Payer: Self-pay | Admitting: Urology

## 2012-12-13 NOTE — Progress Notes (Signed)
NPO AFTER MN. ARRIVES AT 1045. NEEDS ISTAT AND EKG.

## 2012-12-16 NOTE — H&P (Signed)
History of Present Illness   I was consulted by Dr. Oliver Barre regarding Chad Murphy's scrotal lesion. He had a skin tag or lesion for a number of years. Because it was rubbing, it is a bit irritated. I believe he self-treated himself with liquid nitrogen and tried to freeze it. Now it formed a blister and it is quite a bit larger and still irritating.   He works the night shift. Normally he voids every few hours and gets up twice during sleeping hours and reports a good flow.   He denies a history of kidney stones, previous GU surgery, and urinary tract infections.   He has no neurologic risk factors or symptoms. His bowel function is normal.   There is no other modifying factors or associated signs or symptoms. There is no other aggravating or relieving factors. The symptoms are mild in severity but persisting.    Past Medical History Problems  1. History of  Hypercholesterolemia 272.0 2. History of  Hypertension 401.9  Surgical History Problems  1. History of  Arthroscopy Knee Left  Current Meds 1. AmLODIPine Besylate TABS; Therapy: (Recorded:07Aug2014) to 2. Aspirin 81 MG Oral Tablet; Therapy: (Recorded:07Aug2014) to 3. Atorvastatin Calcium TABS; Therapy: (Recorded:07Aug2014) to 4. Lisinopril TABS; Therapy: (Recorded:07Aug2014) to  Allergies Medication  1. No Known Drug Allergies  Family History Problems  1. Family history of  Family Health Status Of Mother - Alive  Social History Problems    Alcohol Use 1 per week   Caffeine Use 1 per day   Former Smoker V15.82 smoked 1/2ppd for 75yrs quit 45yrs ago   Marital History - Currently Married   Occupation: Network engineer  Review of Systems Constitutional, skin, eye, otolaryngeal, hematologic/lymphatic, cardiovascular, pulmonary, endocrine, gastrointestinal, neurological and psychiatric system(s) were reviewed and pertinent findings if present are noted.  Genitourinary: nocturia.  Musculoskeletal: back  pain and joint pain.    Vitals Vital Signs [Data Includes: Last 1 Day]  07Aug2014 09:07AM  BMI Calculated: 35.55 BSA Calculated: 2.23 Height: 5 ft 9 in Weight: 240 lb  Blood Pressure: 155 / 97 Temperature: 98.2 F Heart Rate: 93  Physical Exam Constitutional: Well nourished and well developed . No acute distress.  ENT:. The ears and nose are normal in appearance.  Neck: The appearance of the neck is normal and no neck mass is present.  Pulmonary: No respiratory distress and normal respiratory rhythm and effort.  Cardiovascular: Heart rate and rhythm are normal . No peripheral edema.  Lymphatics: The femoral and inguinal nodes are not enlarged or tender.  Skin: Normal skin turgor, no visible rash and no visible skin lesions.  Neuro/Psych:. Mood and affect are appropriate.   . Abdomen: No mass or tenderness.   . Genitourinary: Male genitalia were normal.  He had a 50 gram benign prostate.  Importantly, he had an elevated 1 cm x 2.5 cm lesion with a smaller base almost the shape of a small football.  It was nontender.  There is no cellulitis or discharge.  It was lacking scrotal skin and almost had a mucosa to it.  It was soft and smooth.    Results/Data   Today [name] underwent a number of tests, which I personally reviewed.   Review of Medical Records: I reviewed his medical records and he was diagnosed with a scrotal mass. He has obesity and erectile dysfunction. He has no allergies. He is on blood pressure medications. Mediations for high lipids.    Assessment Assessed  1. Scrotal Lesion Single  2. Nocturia 788.43  Discussion/Summary   In my opinion, Chad Murphy has a benign scrotal lesion and what appears today as a reaction to his self-treatment. I went over local excision. Pros and cons and risks were described. Diagram was made. Risks including bleeding, pain, infection, and possible recurrence was noted. Injury to other structures and sequelae were  noted.   Chad Murphy would like to proceed. Limitations postoperatively for a week were discussed. I am going to send a copy of my note to Dr. Oliver Barre to keep him updated on his treatment course.  After a thorough review of the management options for the patient's condition the patient  elected to proceed with surgical therapy as noted above. We have discussed the potential benefits and risks of the procedure, side effects of the proposed treatment, the likelihood of the patient achieving the goals of the procedure, and any potential problems that might occur during the procedure or recuperation. Informed consent has been obtained.

## 2012-12-17 ENCOUNTER — Encounter (HOSPITAL_BASED_OUTPATIENT_CLINIC_OR_DEPARTMENT_OTHER)
Admission: RE | Disposition: A | Discharge status: Home / Self Care | Payer: Self-pay | Source: Ambulatory Visit | Attending: Urology

## 2012-12-17 ENCOUNTER — Encounter (HOSPITAL_BASED_OUTPATIENT_CLINIC_OR_DEPARTMENT_OTHER): Payer: Self-pay | Admitting: Anesthesiology

## 2012-12-17 ENCOUNTER — Ambulatory Visit (HOSPITAL_BASED_OUTPATIENT_CLINIC_OR_DEPARTMENT_OTHER)
Admission: RE | Admit: 2012-12-17 | Discharge: 2012-12-17 | Disposition: A | Discharge status: Home / Self Care | Payer: BC Managed Care – PPO | Source: Ambulatory Visit | Attending: Urology | Admitting: Urology

## 2012-12-17 ENCOUNTER — Encounter (HOSPITAL_BASED_OUTPATIENT_CLINIC_OR_DEPARTMENT_OTHER): Payer: Self-pay

## 2012-12-17 ENCOUNTER — Ambulatory Visit (HOSPITAL_BASED_OUTPATIENT_CLINIC_OR_DEPARTMENT_OTHER): Payer: BC Managed Care – PPO | Admitting: Anesthesiology

## 2012-12-17 DIAGNOSIS — L988 Other specified disorders of the skin and subcutaneous tissue: Secondary | ICD-10-CM | POA: Insufficient documentation

## 2012-12-17 DIAGNOSIS — Z79899 Other long term (current) drug therapy: Secondary | ICD-10-CM | POA: Insufficient documentation

## 2012-12-17 DIAGNOSIS — I10 Essential (primary) hypertension: Secondary | ICD-10-CM | POA: Insufficient documentation

## 2012-12-17 DIAGNOSIS — R351 Nocturia: Secondary | ICD-10-CM | POA: Insufficient documentation

## 2012-12-17 DIAGNOSIS — Z7982 Long term (current) use of aspirin: Secondary | ICD-10-CM | POA: Insufficient documentation

## 2012-12-17 DIAGNOSIS — E7800 Pure hypercholesterolemia, unspecified: Secondary | ICD-10-CM | POA: Insufficient documentation

## 2012-12-17 HISTORY — DX: Personal history of other diseases of the musculoskeletal system and connective tissue: Z87.39

## 2012-12-17 HISTORY — PX: SCROTAL EXPLORATION: SHX2386

## 2012-12-17 HISTORY — DX: Essential (primary) hypertension: I10

## 2012-12-17 HISTORY — DX: Hyperlipidemia, unspecified: E78.5

## 2012-12-17 HISTORY — DX: Disorder of male genital organs, unspecified: N50.9

## 2012-12-17 LAB — POCT I-STAT 4, (NA,K, GLUC, HGB,HCT)
Glucose, Bld: 90 mg/dL (ref 70–99)
HCT: 46 % (ref 39.0–52.0)
Potassium: 4.1 mEq/L (ref 3.5–5.1)

## 2012-12-17 SURGERY — EXPLORATION, SCROTUM
Anesthesia: General | Site: Scrotum | Wound class: Clean Contaminated

## 2012-12-17 MED ORDER — DEXAMETHASONE SODIUM PHOSPHATE 4 MG/ML IJ SOLN
INTRAMUSCULAR | Status: DC | PRN
  Administered 2012-12-17: 10 mg via INTRAVENOUS

## 2012-12-17 MED ORDER — PROPOFOL 10 MG/ML IV BOLUS
INTRAVENOUS | Status: DC | PRN
  Administered 2012-12-17: 300 mg via INTRAVENOUS

## 2012-12-17 MED ORDER — LACTATED RINGERS IV SOLN
INTRAVENOUS | Status: DC
  Administered 2012-12-17: 11:00:00 via INTRAVENOUS
  Filled 2012-12-17: qty 1000

## 2012-12-17 MED ORDER — LIDOCAINE HCL (CARDIAC) 20 MG/ML IV SOLN
INTRAVENOUS | Status: DC | PRN
  Administered 2012-12-17: 80 mg via INTRAVENOUS

## 2012-12-17 MED ORDER — HYDROMORPHONE HCL PF 1 MG/ML IJ SOLN
0.2500 mg | INTRAMUSCULAR | Status: DC | PRN
  Filled 2012-12-17: qty 1

## 2012-12-17 MED ORDER — FENTANYL CITRATE 0.05 MG/ML IJ SOLN
INTRAMUSCULAR | Status: DC | PRN
  Administered 2012-12-17 (×2): 50 ug via INTRAVENOUS

## 2012-12-17 MED ORDER — ONDANSETRON HCL 4 MG/2ML IJ SOLN
INTRAMUSCULAR | Status: DC | PRN
  Administered 2012-12-17: 4 mg via INTRAVENOUS

## 2012-12-17 MED ORDER — OXYCODONE HCL 5 MG/5ML PO SOLN
5.0000 mg | Freq: Once | ORAL | Status: DC | PRN
  Filled 2012-12-17: qty 5

## 2012-12-17 MED ORDER — MIDAZOLAM HCL 5 MG/5ML IJ SOLN
INTRAMUSCULAR | Status: DC | PRN
  Administered 2012-12-17: 2 mg via INTRAVENOUS

## 2012-12-17 MED ORDER — BUPIVACAINE HCL (PF) 0.25 % IJ SOLN
INTRAMUSCULAR | Status: DC | PRN
  Administered 2012-12-17: 5 mL

## 2012-12-17 MED ORDER — PROMETHAZINE HCL 25 MG/ML IJ SOLN
6.2500 mg | INTRAMUSCULAR | Status: DC | PRN
  Filled 2012-12-17: qty 1

## 2012-12-17 MED ORDER — OXYCODONE HCL 5 MG PO TABS
5.0000 mg | ORAL_TABLET | Freq: Once | ORAL | Status: DC | PRN
  Filled 2012-12-17: qty 1

## 2012-12-17 MED ORDER — HYDROCODONE-ACETAMINOPHEN 5-325 MG PO TABS: 1.0000 | ORAL_TABLET | Freq: Four times a day (QID) | ORAL | Status: DC | PRN

## 2012-12-17 MED ORDER — CEPHALEXIN 250 MG PO CAPS: 500.0000 mg | ORAL_CAPSULE | Freq: Three times a day (TID) | ORAL | Status: DC

## 2012-12-17 MED ORDER — MEPERIDINE HCL 25 MG/ML IJ SOLN
6.2500 mg | INTRAMUSCULAR | Status: DC | PRN
  Filled 2012-12-17: qty 1

## 2012-12-17 MED ORDER — CEFAZOLIN SODIUM-DEXTROSE 2-3 GM-% IV SOLR
2.0000 g | INTRAVENOUS | Status: AC
  Administered 2012-12-17: 2 g via INTRAVENOUS
  Filled 2012-12-17: qty 50

## 2012-12-17 MED ORDER — CEFAZOLIN SODIUM 1-5 GM-% IV SOLN
1.0000 g | INTRAVENOUS | Status: DC
  Filled 2012-12-17: qty 50

## 2012-12-17 SURGICAL SUPPLY — 41 items
APPLICATOR COTTON TIP 6IN STRL (MISCELLANEOUS) ×2 IMPLANT
BANDAGE GAUZE ELAST BULKY 4 IN (GAUZE/BANDAGES/DRESSINGS) ×2 IMPLANT
BLADE SURG 10 STRL SS (BLADE) IMPLANT
BLADE SURG 15 STRL LF DISP TIS (BLADE) ×1 IMPLANT
BLADE SURG 15 STRL SS (BLADE) ×2
BLADE SURG ROTATE 9660 (MISCELLANEOUS) ×2 IMPLANT
CANISTER SUCTION 1200CC (MISCELLANEOUS) IMPLANT
CANISTER SUCTION 2500CC (MISCELLANEOUS) IMPLANT
CLEANER CAUTERY TIP 5X5 PAD (MISCELLANEOUS) ×1 IMPLANT
CLOTH BEACON ORANGE TIMEOUT ST (SAFETY) ×2 IMPLANT
COVER MAYO STAND STRL (DRAPES) ×2 IMPLANT
COVER TABLE BACK 60X90 (DRAPES) ×2 IMPLANT
DISSECTOR ROUND CHERRY 3/8 STR (MISCELLANEOUS) IMPLANT
DRAIN PENROSE 18X1/4 LTX STRL (WOUND CARE) IMPLANT
DRAPE PED LAPAROTOMY (DRAPES) ×2 IMPLANT
ELECT REM PT RETURN 9FT ADLT (ELECTROSURGICAL) ×2
ELECTRODE REM PT RTRN 9FT ADLT (ELECTROSURGICAL) ×1 IMPLANT
GLOVE BIO SURGEON STRL SZ 6 (GLOVE) ×1 IMPLANT
GLOVE BIO SURGEON STRL SZ 6.5 (GLOVE) ×1 IMPLANT
GLOVE BIO SURGEON STRL SZ7.5 (GLOVE) ×2 IMPLANT
GOWN PREVENTION PLUS XLARGE (GOWN DISPOSABLE) ×1 IMPLANT
GOWN STRL NON-REIN LRG LVL3 (GOWN DISPOSABLE) ×1 IMPLANT
LOOP VESSEL MAXI BLUE (MISCELLANEOUS) IMPLANT
NEEDLE HYPO 22GX1.5 SAFETY (NEEDLE) ×2 IMPLANT
NS IRRIG 500ML POUR BTL (IV SOLUTION) IMPLANT
PACK BASIN DAY SURGERY FS (CUSTOM PROCEDURE TRAY) ×2 IMPLANT
PAD CLEANER CAUTERY TIP 5X5 (MISCELLANEOUS) ×1
PENCIL BUTTON HOLSTER BLD 10FT (ELECTRODE) ×2 IMPLANT
STRIP CLOSURE SKIN 1/2X4 (GAUZE/BANDAGES/DRESSINGS) IMPLANT
SUPPORT SCROTAL LG STRP (MISCELLANEOUS) ×3 IMPLANT
SUT CHROMIC 0 SH (SUTURE) IMPLANT
SUT CHROMIC 3 0 SH 27 (SUTURE) ×3 IMPLANT
SUT CHROMIC 3 0 TIES (SUTURE) ×1 IMPLANT
SUT CHROMIC GUT AB #0 18 (SUTURE) IMPLANT
SUT PROLENE 3 0 PS 2 (SUTURE) IMPLANT
SUT VICRYL 4-0 PS2 18IN ABS (SUTURE) IMPLANT
SYR BULB IRRIGATION 50ML (SYRINGE) IMPLANT
SYR CONTROL 10ML LL (SYRINGE) ×2 IMPLANT
TUBE CONNECTING 12X1/4 (SUCTIONS) IMPLANT
WATER STERILE IRR 500ML POUR (IV SOLUTION) IMPLANT
YANKAUER SUCT BULB TIP NO VENT (SUCTIONS) IMPLANT

## 2012-12-17 NOTE — Anesthesia Preprocedure Evaluation (Addendum)
Anesthesia Evaluation  Patient identified by MRN, date of birth, ID band Patient awake    Reviewed: Allergy & Precautions, H&P , NPO status , Patient's Chart, lab work & pertinent test results  Airway Mallampati: II TM Distance: >3 FB Neck ROM: Full    Dental  (+) Dental Advisory Given and Edentulous Upper   Pulmonary neg pulmonary ROS,  breath sounds clear to auscultation        Cardiovascular hypertension, Pt. on medications Rhythm:Regular Rate:Normal     Neuro/Psych negative neurological ROS  negative psych ROS   GI/Hepatic negative GI ROS, Neg liver ROS,   Endo/Other  negative endocrine ROS  Renal/GU negative Renal ROS     Musculoskeletal negative musculoskeletal ROS (+)   Abdominal (+) + obese,   Peds  Hematology negative hematology ROS (+)   Anesthesia Other Findings   Reproductive/Obstetrics                          Anesthesia Physical Anesthesia Plan  ASA: II  Anesthesia Plan: General   Post-op Pain Management:    Induction: Intravenous  Airway Management Planned: LMA  Additional Equipment:   Intra-op Plan:   Post-operative Plan: Extubation in OR  Informed Consent: I have reviewed the patients History and Physical, chart, labs and discussed the procedure including the risks, benefits and alternatives for the proposed anesthesia with the patient or authorized representative who has indicated his/her understanding and acceptance.   Dental advisory given  Plan Discussed with: CRNA  Anesthesia Plan Comments:         Anesthesia Quick Evaluation

## 2012-12-17 NOTE — Transfer of Care (Signed)
Immediate Anesthesia Transfer of Care Note  Patient: Chad Murphy  Procedure(s) Performed: Procedure(s): EXCISION OF SCROTAL LESION (N/A)  Patient Location: PACU  Anesthesia Type:General  Level of Consciousness: awake, alert  and oriented  Airway & Oxygen Therapy: Patient Spontanous Breathing and Patient connected to nasal cannula oxygen  Post-op Assessment: Report given to PACU RN  Post vital signs: Reviewed and stable  Complications: No apparent anesthesia complications

## 2012-12-17 NOTE — Interval H&P Note (Signed)
History and Physical Interval Note:  12/17/2012 6:59 AM  Chad Murphy  has presented today for surgery, with the diagnosis of SCROTAL LESION  The various methods of treatment have been discussed with the patient and family. After consideration of risks, benefits and other options for treatment, the patient has consented to  Procedure(s): EXCISION OF SCROTAL LESION (N/A) as a surgical intervention .  The patient's history has been reviewed, patient examined, no change in status, stable for surgery.  I have reviewed the patient's chart and labs.  Questions were answered to the patient's satisfaction.     [name]

## 2012-12-17 NOTE — Op Note (Signed)
Preoperative diagnosis: Scrotal lesion Postoperative diagnosis: Scrotal lesion Surgery: Excision of scrotal lesion greater than 2 cm in size Surgeon: Dr. Lorin Picket Ariya Bohannon  The patient has the above diagnoses consent the above procedure. He self treated himself with liquid nitrogen and had the above deformity approximate 1 0.5 x 2 cm in size. It had a soft base.  An elliptical incision was made after being marked with marking pen. With scalpel and cautery was easily removed and sent to pathology. I use a lot of fulguration to stop the vascular dart os fascia. Hemostasis excellent.   I closed the elliptical incision with interrupted 3-0 chromic and used a pressure dressing with fluffs and mesh pants  The patient was taken to recovery

## 2012-12-17 NOTE — Anesthesia Procedure Notes (Signed)
Date/Time: 12/17/2012 12:09 PM Performed by: Maris Berger T Comments: Upper denture left in place, pt states in solid with adhesive and prefers not to remove.  Gauze roll between teeth

## 2012-12-18 ENCOUNTER — Encounter (HOSPITAL_BASED_OUTPATIENT_CLINIC_OR_DEPARTMENT_OTHER): Payer: Self-pay | Admitting: Urology

## 2012-12-18 NOTE — Anesthesia Postprocedure Evaluation (Signed)
Anesthesia Post Note  Patient: Chad Murphy  Procedure(s) Performed: Procedure(s) (LRB): EXCISION OF SCROTAL LESION (N/A)  Anesthesia type: General  Patient location: PACU  Post pain: Pain level controlled  Post assessment: Post-op Vital signs reviewed  Last Vitals: BP 133/88  Pulse 67  Temp(Src) 36.1 C (Oral)  Resp 18  Ht 5\' 9"  (1.753 m)  Wt 238 lb (107.956 kg)  BMI 35.13 kg/m2  SpO2 99%  Post vital signs: Reviewed  Level of consciousness: sedated  Complications: No apparent anesthesia complications

## 2013-03-13 ENCOUNTER — Other Ambulatory Visit: Payer: Self-pay

## 2013-07-04 ENCOUNTER — Telehealth: Payer: Self-pay | Admitting: Internal Medicine

## 2013-07-04 NOTE — Telephone Encounter (Signed)
Ok for morning cpx next wk

## 2013-07-04 NOTE — Telephone Encounter (Signed)
Pt request to be work in for physical in the morning appt. Please advise.

## 2013-07-07 NOTE — Telephone Encounter (Signed)
Scheduled for March 13.  Pt is aware.

## 2013-07-17 ENCOUNTER — Other Ambulatory Visit (INDEPENDENT_AMBULATORY_CARE_PROVIDER_SITE_OTHER): Payer: BC Managed Care – PPO

## 2013-07-17 ENCOUNTER — Ambulatory Visit: Payer: BC Managed Care – PPO

## 2013-07-17 DIAGNOSIS — Z0000 Encounter for general adult medical examination without abnormal findings: Secondary | ICD-10-CM

## 2013-07-17 DIAGNOSIS — R7309 Other abnormal glucose: Secondary | ICD-10-CM

## 2013-07-17 LAB — PSA: PSA: 1.27 ng/mL (ref 0.10–4.00)

## 2013-07-17 LAB — CBC WITH DIFFERENTIAL/PLATELET
Basophils Absolute: 0 10*3/uL (ref 0.0–0.1)
Basophils Relative: 0.1 % (ref 0.0–3.0)
EOS ABS: 0 10*3/uL (ref 0.0–0.7)
Eosinophils Relative: 0.5 % (ref 0.0–5.0)
HEMATOCRIT: 46.3 % (ref 39.0–52.0)
Hemoglobin: 15.4 g/dL (ref 13.0–17.0)
LYMPHS ABS: 0.5 10*3/uL — AB (ref 0.7–4.0)
Lymphocytes Relative: 6.1 % — ABNORMAL LOW (ref 12.0–46.0)
MCHC: 33.2 g/dL (ref 30.0–36.0)
MCV: 89.3 fl (ref 78.0–100.0)
MONO ABS: 0.5 10*3/uL (ref 0.1–1.0)
Monocytes Relative: 5.4 % (ref 3.0–12.0)
NEUTROS PCT: 87.9 % — AB (ref 43.0–77.0)
Neutro Abs: 7.5 10*3/uL (ref 1.4–7.7)
PLATELETS: 209 10*3/uL (ref 150.0–400.0)
RBC: 5.19 Mil/uL (ref 4.22–5.81)
RDW: 14.9 % — ABNORMAL HIGH (ref 11.5–14.6)
WBC: 8.6 10*3/uL (ref 4.5–10.5)

## 2013-07-17 LAB — URINALYSIS, ROUTINE W REFLEX MICROSCOPIC
Bilirubin Urine: NEGATIVE
Hgb urine dipstick: NEGATIVE
Ketones, ur: NEGATIVE
LEUKOCYTES UA: NEGATIVE
NITRITE: NEGATIVE
RBC / HPF: NONE SEEN (ref 0–?)
SPECIFIC GRAVITY, URINE: 1.01 (ref 1.000–1.030)
Total Protein, Urine: NEGATIVE
UROBILINOGEN UA: 0.2 (ref 0.0–1.0)
Urine Glucose: NEGATIVE
pH: 6.5 (ref 5.0–8.0)

## 2013-07-17 LAB — HEMOGLOBIN A1C: Hgb A1c MFr Bld: 5.6 % (ref 4.6–6.5)

## 2013-07-17 LAB — LIPID PANEL
CHOLESTEROL: 155 mg/dL (ref 0–200)
HDL: 53 mg/dL (ref >39.00)
LDL CALC: 90 mg/dL (ref 0–99)
Total CHOL/HDL Ratio: 3
Triglycerides: 61 mg/dL (ref 0.0–149.0)
VLDL: 12.2 mg/dL (ref 0.0–40.0)

## 2013-07-17 LAB — HEPATIC FUNCTION PANEL
ALT: 35 U/L (ref 0–53)
AST: 23 U/L (ref 0–37)
Albumin: 4.3 g/dL (ref 3.5–5.2)
Alkaline Phosphatase: 93 U/L (ref 39–117)
BILIRUBIN DIRECT: 0.1 mg/dL (ref 0.0–0.3)
BILIRUBIN TOTAL: 0.8 mg/dL (ref 0.3–1.2)
TOTAL PROTEIN: 7.4 g/dL (ref 6.0–8.3)

## 2013-07-17 LAB — BASIC METABOLIC PANEL
BUN: 15 mg/dL (ref 6–23)
CHLORIDE: 110 meq/L (ref 96–112)
CO2: 29 mEq/L (ref 19–32)
Calcium: 9.4 mg/dL (ref 8.4–10.5)
Creatinine, Ser: 1.1 mg/dL (ref 0.4–1.5)
GFR: 88.32 mL/min (ref >60.00)
GLUCOSE: 151 mg/dL — AB (ref 70–99)
POTASSIUM: 4.3 meq/L (ref 3.5–5.1)
SODIUM: 142 meq/L (ref 135–145)

## 2013-07-17 LAB — TSH: TSH: 0.31 u[IU]/mL — ABNORMAL LOW (ref 0.35–5.50)

## 2013-07-18 ENCOUNTER — Ambulatory Visit (INDEPENDENT_AMBULATORY_CARE_PROVIDER_SITE_OTHER): Payer: BC Managed Care – PPO | Admitting: Internal Medicine

## 2013-07-18 ENCOUNTER — Encounter: Payer: Self-pay | Admitting: Internal Medicine

## 2013-07-18 VITALS — BP 142/80 | HR 93 | Temp 98.0°F | Ht 68.0 in | Wt 246.0 lb

## 2013-07-18 DIAGNOSIS — R7309 Other abnormal glucose: Secondary | ICD-10-CM

## 2013-07-18 DIAGNOSIS — E785 Hyperlipidemia, unspecified: Secondary | ICD-10-CM

## 2013-07-18 DIAGNOSIS — R7302 Impaired glucose tolerance (oral): Secondary | ICD-10-CM | POA: Insufficient documentation

## 2013-07-18 DIAGNOSIS — M25562 Pain in left knee: Secondary | ICD-10-CM

## 2013-07-18 DIAGNOSIS — I10 Essential (primary) hypertension: Secondary | ICD-10-CM

## 2013-07-18 DIAGNOSIS — Z0000 Encounter for general adult medical examination without abnormal findings: Secondary | ICD-10-CM

## 2013-07-18 DIAGNOSIS — M25569 Pain in unspecified knee: Secondary | ICD-10-CM

## 2013-07-18 MED ORDER — LISINOPRIL 40 MG PO TABS: 40.0000 mg | ORAL_TABLET | Freq: Every evening | ORAL | Status: DC

## 2013-07-18 MED ORDER — ATORVASTATIN CALCIUM 40 MG PO TABS: 40.0000 mg | ORAL_TABLET | Freq: Every day | ORAL | Status: DC

## 2013-07-18 MED ORDER — AMLODIPINE BESYLATE 5 MG PO TABS: 5.0000 mg | ORAL_TABLET | Freq: Every evening | ORAL | Status: DC

## 2013-07-18 MED ORDER — NAPROXEN 500 MG PO TABS: 500.0000 mg | ORAL_TABLET | Freq: Two times a day (BID) | ORAL | Status: DC

## 2013-07-18 NOTE — Assessment & Plan Note (Signed)
stable overall by history and exam, recent data reviewed with pt, and pt to continue medical treatment as before,  to f/u any worsening symptoms or concerns BP Readings from Last 3 Encounters:  07/18/13 142/80  12/17/12 133/88  12/17/12 133/88

## 2013-07-18 NOTE — Patient Instructions (Addendum)
Please take all new medication as prescribed - the anti-inflammatory  Please continue all other medications as before, and refills have been done if requested. Please have the pharmacy call with any other refills you may need.  Please continue your efforts at being more active, low cholesterol diet, and weight control. You are otherwise up to date with prevention measures today.  You will be contacted regarding the referral for: colonoscopy - Dr Natasha MeadMann  You are given the copy of your lab work  You will be contacted regarding the referral for: Dr Katrinka BlazingSmith (sport med) as he is unable to see you today  Please return in 6 months, or sooner if needed, with Lab testing done 3-5 days before

## 2013-07-18 NOTE — Assessment & Plan Note (Signed)
For sport med referral/dr smith, nsaid prn

## 2013-07-18 NOTE — Progress Notes (Signed)
Subjective:    Patient ID: Chad Murphy, male    DOB: 1960-11-25, 53 y.o.   MRN: [id number]  HPI  Here for wellness and f/u;  Overall doing ok;  Pt denies CP, worsening SOB, DOE, wheezing, orthopnea, PND, worsening LE edema, palpitations, dizziness or syncope.  Pt denies neurological change such as new headache, facial or extremity weakness.  Pt denies polydipsia, polyuria, or low sugar symptoms. Pt states overall good compliance with treatment and medications, good tolerability, and has been trying to follow lower cholesterol diet.  Pt denies worsening depressive symptoms, suicidal ideation or panic. No fever, night sweats, wt loss, loss of appetite, or other constitutional symptoms.  Pt states good ability with ADL's, has low fall risk, home safety reviewed and adequate, no other significant changes in hearing or vision, and only occasionally active with exercise. S/p right ankle surgury dec 2014, out of work since then, but going back this weekend.    Pt did have right LBP without change in severity, bowel or bladder change, fever, wt loss,  worsening LE pain/numbness/weakness, gait change or falls, but no pain for 4 wks.  Has ongoing chronic left knee pain s/p TKR, last seen per Dr Antonieta Loveless (retired) about 5 yrs, now really limiting his mobility, has gained several lbs, sugar has been slightly higher recently as well.  Past Medical History  Diagnosis Date  . Hyperlipidemia   . History of low back pain   . Hypertension, essential   . Scrotal lesion    Past Surgical History  Procedure Laterality Date  . Knee arthroscopy Left 2000  . Wrist ganglion excision Left 2002  . Excision epidermoid cyst, abdominal wall  05-19-2003  . Scrotal exploration N/A 12/17/2012    Procedure: EXCISION OF SCROTAL LESION;  Surgeon: Martina Sinner, MD;  Location: [facility];  Service: Urology;  Laterality: N/A;    reports that he quit smoking about 20 years ago. His smoking use  included Cigarettes. He has a 3 pack-year smoking history. He has never used smokeless tobacco. He reports that he drinks alcohol. He reports that he does not use illicit drugs. family history includes Allergies in his other; Arthritis in his other; Cancer in his sister; Diabetes in his other; Hypertension in his other. No Known Allergies Current Outpatient Prescriptions on File Prior to Visit  Medication Sig Dispense Refill  . Multiple Vitamin (MULTIVITAMIN) tablet Take 1 tablet by mouth daily.      . tadalafil (CIALIS) 20 MG tablet Take 10 mg by mouth daily as needed. TAKE 1 TABLET (20 MG TOTAL) BY MOUTH DAILY AS NEEDED FOR ERECTILE DYSFUNCTION.       No current facility-administered medications on file prior to visit.    Review of Systems Constitutional: Negative for diaphoresis, activity change, appetite change or unexpected weight change.  HENT: Negative for hearing loss, ear pain, facial swelling, mouth sores and neck stiffness.   Eyes: Negative for pain, redness and visual disturbance.  Respiratory: Negative for shortness of breath and wheezing.   Cardiovascular: Negative for chest pain and palpitations.  Gastrointestinal: Negative for diarrhea, blood in stool, abdominal distention or other pain Genitourinary: Negative for hematuria, flank pain or change in urine volume.  Musculoskeletal: Negative for myalgias and joint swelling.  Skin: Negative for color change and wound.  Neurological: Negative for syncope and numbness. other than noted Hematological: Negative for adenopathy.  Psychiatric/Behavioral: Negative for hallucinations, self-injury, decreased concentration and agitation.      Objective:  Physical Exam BP 142/80  Pulse 93  Temp(Src) 98 F (36.7 C) (Oral)  Ht 5\' 8"  (1.727 m)  Wt 246 lb (111.585 kg)  BMI 37.41 kg/m2  SpO2 97% VS noted,  Constitutional: Pt is oriented to person, place, and time. Appears well-developed and well-nourished.  Head: Normocephalic and  atraumatic.  Right Ear: External ear normal.  Left Ear: External ear normal.  Nose: Nose normal.  Mouth/Throat: Oropharynx is clear and moist.  Eyes: Conjunctivae and EOM are normal. Pupils are equal, round, and reactive to light.  Neck: Normal range of motion. Neck supple. No JVD present. No tracheal deviation present.  Cardiovascular: Normal rate, regular rhythm, normal heart sounds and intact distal pulses.   Pulmonary/Chest: Effort normal and breath sounds normal.  Abdominal: Soft. Bowel sounds are normal. There is no tenderness. No HSM  Musculoskeletal: Normal range of motion. Exhibits no edema.  Lymphadenopathy:  Has no cervical adenopathy.  Neurological: Pt is alert and oriented to person, place, and time. Pt has normal reflexes. No cranial nerve deficit.  Skin: Skin is warm and dry. No rash noted.  Psychiatric:  Has  normal mood and affect. Behavior is normal.  Has mild right lumbar paravertebral spasm, nontender Left knee NT, bony deg changes noted    Assessment & Plan:

## 2013-07-18 NOTE — Progress Notes (Signed)
Pre visit review using our clinic review tool, if applicable. No additional management support is needed unless otherwise documented below in the visit note. 

## 2013-07-18 NOTE — Assessment & Plan Note (Signed)
stable overall by history and exam, recent data reviewed with pt, and pt to continue medical treatment as before,  to f/u any worsening symptoms or concerns, to increase activity

## 2013-07-18 NOTE — Assessment & Plan Note (Signed)

## 2013-07-18 NOTE — Assessment & Plan Note (Signed)
stable overall by history and exam, recent data reviewed with pt, and pt to continue medical treatment as before,  to f/u any worsening symptoms or concerns Lab Results  Component Value Date   LDLCALC 90 07/17/2013

## 2013-07-21 ENCOUNTER — Telehealth: Payer: Self-pay | Admitting: Internal Medicine

## 2013-07-21 NOTE — Telephone Encounter (Signed)
Relevant patient education assigned to patient using Emmi. ° °

## 2013-07-25 ENCOUNTER — Ambulatory Visit (INDEPENDENT_AMBULATORY_CARE_PROVIDER_SITE_OTHER): Payer: BC Managed Care – PPO | Admitting: Family Medicine

## 2013-07-25 ENCOUNTER — Other Ambulatory Visit (INDEPENDENT_AMBULATORY_CARE_PROVIDER_SITE_OTHER): Payer: BC Managed Care – PPO

## 2013-07-25 ENCOUNTER — Encounter: Payer: Self-pay | Admitting: Family Medicine

## 2013-07-25 VITALS — BP 152/80 | HR 86 | Wt 246.0 lb

## 2013-07-25 DIAGNOSIS — M25569 Pain in unspecified knee: Secondary | ICD-10-CM

## 2013-07-25 DIAGNOSIS — M1710 Unilateral primary osteoarthritis, unspecified knee: Secondary | ICD-10-CM

## 2013-07-25 DIAGNOSIS — M25562 Pain in left knee: Secondary | ICD-10-CM

## 2013-07-25 NOTE — Progress Notes (Signed)
Chad Murphy D.O. [facility] [address] [name], [name] Phone: [phone] Subjective:    I'm seeing this patient by the request  of:  Oliver BarreJames John, MD   CC: left knee pain  BJY:NWGNFAOZHYHPI:Subjective Chad Murphy is a 53 y.o. male coming in with complaint of left knee pain. Patient states he has had this pain for quite some time. Patient states that over 5 years ago was last time he saw an orthopedic surgeon who did state that he had bone-on-bone arthritis and may need a knee replacement. Patient was responding very well to steroid injections but has not had one for 5 years. Patient is able to do activities of daily living but does have pain mostly on the medial aspect of that left knee. States it is worse with activity or walking long distances. Discuss it is more of a dull aching pain a can of a throbbing pain with rest. Denies that it is waking him up at night. Patient has not tried any other modalities other than some anti-inflammatories with minimal benefit. Denies any radiation down the leg or any numbness or tingling or any weakness.      Past medical history, social, surgical and family history all reviewed in electronic medical record.   Review of Systems: No headache, visual changes, nausea, vomiting, diarrhea, constipation, dizziness, abdominal pain, skin rash, fevers, chills, night sweats, weight loss, swollen lymph nodes, body aches, joint swelling, muscle aches, chest pain, shortness of breath, mood changes.   Objective Blood pressure 152/80, pulse 86, weight 246 lb (111.585 kg), SpO2 96.00%.  General: No apparent distress alert and oriented x3 mood and affect normal, dressed appropriately.  HEENT: Pupils equal, extraocular movements intact  Respiratory: Patient's speak in full sentences and does not appear short of breath  Cardiovascular: No lower extremity edema, non tender, no erythema  Skin: Warm dry intact with no signs of infection or rash on  extremities or on axial skeleton.  Abdomen: Soft nontender  Neuro: Cranial nerves II through XII are intact, neurovascularly intact in all extremities with 2+ DTRs and 2+ pulses.  Lymph: No lymphadenopathy of posterior or anterior cervical chain or axillae bilaterally.  Gait normal with good balance and coordination.  MSK:  Non tender with full range of motion and good stability and symmetric strength and tone of shoulders, elbows, wrist, hip, and ankles bilaterally.  Knee: Left Normal to inspection mild medial osteoarthritic changes Palpation normal with no warmth, does have medial joint line tenderness, but no patellar tenderness, or condyle tenderness. ROM full in flexion and extension and lower leg rotation. Ligaments with solid consistent endpoints including ACL, PCL, LCL, MCL. Negative Mcmurray's, Apley's, and Thessalonian tests. Non painful patellar compression. Patellar glide without crepitus. Patellar and quadriceps tendons unremarkable. Hamstring and quadriceps strength is normal.  Contralateral knee unremarkable  MSK US performed of: The This study was ordered, performed, and interpreted by Terrilee FilesZach Kristelle Cavallaro D.O.  Knee: All structures visualized. Patient does have severe osteophytic changes of the medial and lateral joint line Meniscus is hard to visualize significant amount of arthritis Patellar Tendon unremarkable on long and transverse views without effusion. No abnormality of prepatellar bursa. LCL and MCL unremarkable on long and transverse views. No abnormality of origin of medial or lateral head of the gastrocnemius.  IMPRESSION: Severe osteoarthritic changes of the knee  After informed written and verbal consent, patient was seated on exam table. Left knee was prepped with alcohol swab and utilizing anterolateral approach, patient's left  knee space was injected with 4:1  marcaine 0.5%: Kenalog 40mg /dL. Patient tolerated the procedure well without immediate  complications.    Impression and Recommendations:     This case required medical decision making of moderate complexity.

## 2013-07-25 NOTE — Assessment & Plan Note (Signed)
Patient does have severe osteophytic changes of his knee. We can get up x-rays to further evaluate. We discussed some exercises and over the counter medications are to be beneficial. We discussed bracing which patient declined. Patient did have an injection with good results. Patient would be a candidate for viscous supplementation in the future. Patient will come back again in 3-4 weeks for further evaluation.

## 2013-07-25 NOTE — Patient Instructions (Signed)
Good to meet you Try exercises 3 times a week. Take tylenol 650 mg three times a day is the best evidence based medicine we have for arthritis.  Glucosamine sulfate 750mg  twice a day is a supplement that has been shown to help moderate to severe arthritis. Vitamin D 2000 IU daily Fish oil 2 grams daily.  Tumeric 500mg  twice daily.  Capsaicin topically up to four times a day may also help with pain. Cortisone injections are an option if these interventions do not seem to make a difference or need more relief. We tried one today If cortisone injections do not help, there are different types of shots that may help but they take longer to take effect.  We can discuss this at follow up.  It's important that you continue to stay active..  Water aerobics and cycling with low resistance are the best two types of exercise for arthritis. Come back and see me in 3 weeks.

## 2013-08-15 ENCOUNTER — Encounter: Payer: Self-pay | Admitting: Family Medicine

## 2013-08-15 ENCOUNTER — Other Ambulatory Visit (INDEPENDENT_AMBULATORY_CARE_PROVIDER_SITE_OTHER): Payer: BC Managed Care – PPO

## 2013-08-15 ENCOUNTER — Ambulatory Visit (INDEPENDENT_AMBULATORY_CARE_PROVIDER_SITE_OTHER): Payer: BC Managed Care – PPO | Admitting: Family Medicine

## 2013-08-15 VITALS — BP 138/84 | HR 86

## 2013-08-15 DIAGNOSIS — M25569 Pain in unspecified knee: Secondary | ICD-10-CM

## 2013-08-15 DIAGNOSIS — M25561 Pain in right knee: Secondary | ICD-10-CM

## 2013-08-15 DIAGNOSIS — M1710 Unilateral primary osteoarthritis, unspecified knee: Secondary | ICD-10-CM

## 2013-08-15 NOTE — Progress Notes (Signed)
  Tawana ScaleZach Murlin Schrieber D.O. [facility] [address] [place], [name] Phone: [phone] Subjective:     CC: left knee pain followup Right knee pain new.   BJY:NWGNFAOZHYHPI:Subjective Genia PlantsCharles R Verne is a 53 y.o. male coming in for followup of left knee pain. Patient was seen previously and was given a corticosteroid injection. Patient states that he is doing remarkably better with his left knee. Patient states it is feeling great and has been walking his dog. Patient states that one week ago while walking the dog he started having some discomfort in the right knee that seemed very similar to his previous pain in the left knee. Patient describes as more of a dull aching sensation with no significant radiation. States mostly on the anterior medial aspect of the knee. Describes it as a dull aching sensation that is worse with certain activity. Denies any clicking popping or giving out on him. Denies any nighttime awakening. Patient was the severity of 6/10.     Past medical history, social, surgical and family history all reviewed in electronic medical record.   Review of Systems: No headache, visual changes, nausea, vomiting, diarrhea, constipation, dizziness, abdominal pain, skin rash, fevers, chills, night sweats, weight loss, swollen lymph nodes, body aches, joint swelling, muscle aches, chest pain, shortness of breath, mood changes.   Objective Blood pressure 138/84, pulse 86, SpO2 98.00%.  General: No apparent distress alert and oriented x3 mood and affect normal, dressed appropriately.  HEENT: Pupils equal, extraocular movements intact  Respiratory: Patient's speak in full sentences and does not appear short of breath  Cardiovascular: No lower extremity edema, non tender, no erythema  Skin: Warm dry intact with no signs of infection or rash on extremities or on axial skeleton.  Abdomen: Soft nontender  Neuro: Cranial nerves II through XII are intact, neurovascularly intact in all  extremities with 2+ DTRs and 2+ pulses.  Lymph: No lymphadenopathy of posterior or anterior cervical chain or axillae bilaterally.  Gait normal with good balance and coordination.  MSK:  Non tender with full range of motion and good stability and symmetric strength and tone of shoulders, elbows, wrist, hip, and ankles bilaterally.  Knee: Right Normal to inspection mild medial osteoarthritic changes Palpation normal with no warmth, does have medial joint line tenderness, but no patellar tenderness, or condyle tenderness. ROM full in flexion and extension and lower leg rotation. Ligaments with solid consistent endpoints including ACL, PCL, LCL, MCL. Negative Mcmurray's, Apley's, and Thessalonian tests. Non painful patellar compression. Patellar glide without crepitus. Patellar and quadriceps tendons unremarkable. Hamstring and quadriceps strength is normal.  Contralateral knee unremarkable but still has some mild crepitus with range of motion.  After informed written and verbal consent, patient was seated on exam table. Right knee was prepped with alcohol swab and utilizing anterolateral approach, patient's right knee space was injected with 4:1  marcaine 0.5%: Kenalog 40mg /dL. Patient tolerated the procedure well without immediate complications.   Impression and Recommendations:     This case required medical decision making of moderate complexity.

## 2013-08-15 NOTE — Assessment & Plan Note (Signed)
Patient had injection into the right knee today. I believe the patient does have underlying arthritis and we will get a baseline standing AP of the bilateral knees. The discussed icing and continued home exercises. Patient did respond very well to his previous injection. Patient will do all other modalities were discussed and come back again in 4 weeks for further evaluation. Patient is continuing to have trouble has failed all conservative therapy and would be a candidate for viscous supplementation.

## 2013-08-15 NOTE — Patient Instructions (Signed)
Good to see you  Injected right knee today xrays when you  Conitnue exercises  See me in 4 weeks.

## 2013-09-02 ENCOUNTER — Other Ambulatory Visit: Payer: Self-pay | Admitting: Internal Medicine

## 2013-09-11 ENCOUNTER — Encounter: Payer: Self-pay | Admitting: Family Medicine

## 2013-09-11 ENCOUNTER — Telehealth: Payer: Self-pay | Admitting: Family Medicine

## 2013-09-11 ENCOUNTER — Ambulatory Visit (INDEPENDENT_AMBULATORY_CARE_PROVIDER_SITE_OTHER): Payer: BC Managed Care – PPO | Admitting: Family Medicine

## 2013-09-11 VITALS — BP 134/84 | HR 77

## 2013-09-11 DIAGNOSIS — M1710 Unilateral primary osteoarthritis, unspecified knee: Secondary | ICD-10-CM

## 2013-09-11 MED ORDER — TRAMADOL HCL 50 MG PO TABS: 50.0000 mg | ORAL_TABLET | Freq: Three times a day (TID) | ORAL | Status: DC | PRN

## 2013-09-11 MED ORDER — DICLOFENAC SODIUM 2 % TD SOLN: 2.0000 "application " | Freq: Two times a day (BID) | TRANSDERMAL | Status: DC

## 2013-09-11 NOTE — Telephone Encounter (Signed)
Switch to anther pharmacy.

## 2013-09-11 NOTE — Patient Instructions (Signed)
Good to see you Think about the injection.  If you decide then call make an appointment and tell them for synvisc.  Continue ice Tramadol up to 3 times a day but try it at home first.  Otherwise we can always do brace or try PT again.  I am here if you need me.

## 2013-09-11 NOTE — Telephone Encounter (Signed)
Pt took the discount card to the pharmacy for [facility].  They told him it would be $700.00 even with the card.  Insurance won't pay on it.  He was told we could order it.

## 2013-09-11 NOTE — Addendum Note (Signed)
Addended by: Edwena FeltyARSON, Pascual Mantel T on: 09/11/2013 11:33 AM   Modules accepted: Orders

## 2013-09-11 NOTE — Assessment & Plan Note (Signed)
Patient does have bilateral osteoarthritic changes. Patient again encouraged to get x-rays for baseline standing. He is to this we discussed continuing the exercises on a regular basis. We discussed the possibility with him failing all conservative therapy including corticosteroid injections, bracing over-the-counter, as well as formal physical therapy and home exercises that he would be a candidate for viscous supplementation. Patient decided not to start this today but thinks he will in the future. We also discussed about other braces the patient declined that today. Patient encouraged to continue good shoes. Patient and will also try topical anti-inflammatory. Patient will come back in 4 weeks for further evaluation or sooner if he decides on viscous supplementation.  Spent greater than 25 minutes with patient face-to-face and had greater than 50% of counseling including as described above in assessment and plan.

## 2013-09-11 NOTE — Progress Notes (Signed)
  Tawana ScaleZach Kasarah Sitts D.O. [facility] [address] [place], [place] [address] Phone: [phone] Subjective:     CC: Bilateral knee pain followup  EXB:[id number]:Subjective Chad Murphy is a 53 y.o. male coming in for followup of bilateral knee pain. 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 Patient did not get x-rays as stated before. Patient did get about 2-3 weeks of relief with the corticosteroid injection but now is back to his baseline. Patient has difficulty even walking greater than 200 feet. Patient states not too he feels like he is having some mild instability. Patient continue the over-the-counter medicines without any significant improvement. Patient like something else more for pain if possible. Denies any swelling. Patient states that the pain can wake him up at night too.     Past medical history, social, surgical and family history all reviewed in electronic medical record.   Review of Systems: No headache, visual changes, nausea, vomiting, diarrhea, constipation, dizziness, abdominal pain, skin rash, fevers, chills, night sweats, weight loss, swollen lymph nodes, body aches, joint swelling, muscle aches, chest pain, shortness of breath, mood changes.   Objective Blood pressure 134/84, pulse 77, SpO2 97.00%.  General: No apparent distress alert and oriented x3 mood and affect normal, dressed appropriately.  HEENT: Pupils equal, extraocular movements intact  Respiratory: Patient's speak in full sentences and does not appear short of breath  Cardiovascular: No lower extremity edema, non tender, no erythema  Skin: Warm dry intact with no signs of infection or rash on extremities or on axial skeleton.  Abdomen: Soft nontender  Neuro: Cranial nerves II through XII are intact, neurovascularly intact in all extremities with 2+ DTRs and 2+ pulses.  Lymph: No lymphadenopathy of posterior or anterior cervical chain or axillae bilaterally.  Gait normal with good  balance and coordination.  MSK:  Non tender with full range of motion and good stability and symmetric strength and tone of shoulders, elbows, wrist, hip, and ankles bilaterally.  Knee:  bilateral Normal to inspection mild medial osteoarthritic changes Palpation normal with no warmth, does have medial joint line tenderness, but no patellar tenderness, or condyle tenderness. ROM full in flexion and extension and lower leg rotation. Ligaments with solid consistent endpoints including ACL, PCL, LCL, MCL. Negative Mcmurray's, Apley's, and Thessalonian tests. Mild painful patellar compression. Patellar glide with moderate crepitus. Patellar and quadriceps tendons unremarkable. Hamstring and quadriceps strength is normal.  Mild instability noted with pivot test     Impression and Recommendations:     This case required medical decision making of moderate complexity.

## 2013-09-24 ENCOUNTER — Ambulatory Visit (INDEPENDENT_AMBULATORY_CARE_PROVIDER_SITE_OTHER): Payer: BC Managed Care – PPO | Admitting: Internal Medicine

## 2013-09-24 ENCOUNTER — Encounter: Payer: Self-pay | Admitting: Internal Medicine

## 2013-09-24 VITALS — BP 128/90 | HR 90 | Temp 98.2°F | Ht 69.0 in | Wt 238.5 lb

## 2013-09-24 DIAGNOSIS — J309 Allergic rhinitis, unspecified: Secondary | ICD-10-CM

## 2013-09-24 DIAGNOSIS — I10 Essential (primary) hypertension: Secondary | ICD-10-CM

## 2013-09-24 DIAGNOSIS — J019 Acute sinusitis, unspecified: Secondary | ICD-10-CM | POA: Insufficient documentation

## 2013-09-24 MED ORDER — METHYLPREDNISOLONE ACETATE 80 MG/ML IJ SUSP
80.0000 mg | Freq: Once | INTRAMUSCULAR | Status: AC
  Administered 2013-09-24: 80 mg via INTRAMUSCULAR

## 2013-09-24 MED ORDER — PREDNISONE 10 MG PO TABS: ORAL_TABLET | ORAL | Status: DC

## 2013-09-24 MED ORDER — LEVOFLOXACIN 250 MG PO TABS: 250.0000 mg | ORAL_TABLET | Freq: Every day | ORAL | Status: DC

## 2013-09-24 NOTE — Progress Notes (Signed)
Pre visit review using our clinic review tool, if applicable. No additional management support is needed unless otherwise documented below in the visit note. 

## 2013-09-24 NOTE — Progress Notes (Signed)
Subjective:    Patient ID: Chad Murphy, male    DOB: 1960-09-20, 53 y.o.   MRN: [id number]  HPI  Here with 2-3 days acute onset fever, facial pain, pressure, headache, general weakness and malaise, and greenish d/c, with mild ST and cough, but pt denies chest pain, wheezing, increased sob or doe, orthopnea, PND, increased LE swelling, palpitations, dizziness or syncope.  Does have several wks ongoing nasal allergy symptoms with clearish congestion, itch and sneezing, without fever, pain, ST, cough, swelling or wheezing.  Pt denies new neurological symptoms such as new headache, or facial or extremity weakness or numbness   Pt denies polydipsia, polyuria,  Past Medical History  Diagnosis Date  . Hyperlipidemia   . History of low back pain   . Hypertension, essential   . Scrotal lesion    Past Surgical History  Procedure Laterality Date  . Knee arthroscopy Left 2000  . Wrist ganglion excision Left 2002  . Excision epidermoid cyst, abdominal wall  05-19-2003  . Scrotal exploration N/A 12/17/2012    Procedure: EXCISION OF SCROTAL LESION;  Surgeon: Martina SinnerScott A MacDiarmid, MD;  [facility] [place];  Service: Urology;  Laterality: N/A;    reports that he quit smoking about 20 years ago. His smoking use included Cigarettes. He has a 3 pack-year smoking history. He has never used smokeless tobacco. He reports that he drinks alcohol. He reports that he does not use illicit drugs. family history includes Allergies in his other; Arthritis in his other; Cancer in his sister; Diabetes in his other; Hypertension in his other. No Known Allergies Current Outpatient Prescriptions on File Prior to Visit  Medication Sig Dispense Refill  . amLODipine (NORVASC) 5 MG tablet Take 1 tablet (5 mg total) by mouth every evening.  90 tablet  3  . atorvastatin (LIPITOR) 40 MG tablet TAKE 1 TABLET BY MOUTH EVERY DAY  90 tablet  3  . Diclofenac Sodium (PENNSAID) 2 % SOLN Place 2 application onto  the skin 2 (two) times daily.  112 g  3  . lisinopril (PRINIVIL,ZESTRIL) 40 MG tablet Take 1 tablet (40 mg total) by mouth every evening.  90 tablet  3  . Multiple Vitamin (MULTIVITAMIN) tablet Take 1 tablet by mouth daily.      . naproxen (NAPROSYN) 500 MG tablet Take 1 tablet (500 mg total) by mouth 2 (two) times daily with a meal. As needed for pain  60 tablet  5  . tadalafil (CIALIS) 20 MG tablet Take 10 mg by mouth daily as needed. TAKE 1 TABLET (20 MG TOTAL) BY MOUTH DAILY AS NEEDED FOR ERECTILE DYSFUNCTION.      . traMADol (ULTRAM) 50 MG tablet Take 1 tablet (50 mg total) by mouth every 8 (eight) hours as needed.  50 tablet  2   No current facility-administered medications on file prior to visit.   Review of Systems  Constitutional: Negative for unusual diaphoresis or other sweats  HENT: Negative for ringing in ear Eyes: Negative for double vision or worsening visual disturbance.  Respiratory: Negative for choking and stridor.   Gastrointestinal: Negative for vomiting or other signifcant bowel change Genitourinary: Negative for hematuria or decreased urine volume.  Musculoskeletal: Negative for other MSK pain or swelling Skin: Negative for color change and worsening wound.  Neurological: Negative for tremors and numbness other than noted  Psychiatric/Behavioral: Negative for decreased concentration or agitation other than above  '    Objective:   Physical Exam BP 128/90  Pulse 90  Temp(Src) 98.2 F (36.8 C) (Oral)  Ht 5\' 9"  (1.753 m)  Wt 238 lb 8 oz (108.183 kg)  BMI 35.20 kg/m2  SpO2 95% VS noted, mild ill Constitutional: Pt appears well-developed, well-nourished.  HENT: Head: NCAT.  Right Ear: External ear normal.  Left Ear: External ear normal.  Bilat tm's with mild erythema.  Max sinus areas mild tender.  Pharynx with mild erythema, no exudate Eyes: . Pupils are equal, round, and reactive to light. Conjunctivae and EOM are normal Neck: Normal range of motion. Neck  supple.  Cardiovascular: Normal rate and regular rhythm.   Pulmonary/Chest: Effort normal and breath sounds normal.  Neurological: Pt is alert. Not confused , motor grossly intact Skin: Skin is warm. No rash Psychiatric: Pt behavior is normal. No agitation.     Assessment & Plan:

## 2013-09-24 NOTE — Patient Instructions (Addendum)
OK to hold on taking the amoxil  Please take all new medication as prescribed - the antibiiotic  You had the steroid shot today  Please take all new medication as prescribed - the prednisone  Please continue all other medications as before, and refills have been done if requested. Please have the pharmacy call with any other refills you may need.

## 2013-09-29 NOTE — Assessment & Plan Note (Signed)
stable overall by history and exam, recent data reviewed with pt, and pt to continue medical treatment as before,  to f/u any worsening symptoms or concerns BP Readings from Last 3 Encounters:  09/24/13 128/90  09/11/13 134/84  08/15/13 138/84

## 2013-09-29 NOTE — Assessment & Plan Note (Signed)
Mild to mod, for antibx course,  to f/u any worsening symptoms or concerns 

## 2013-09-29 NOTE — Assessment & Plan Note (Signed)
With marked seasonal flare, for depomedrol IM, predpac asd,  to f/u any worsening symptoms or concerns

## 2013-10-20 ENCOUNTER — Encounter (HOSPITAL_COMMUNITY): Payer: Self-pay | Admitting: Emergency Medicine

## 2013-10-20 ENCOUNTER — Emergency Department (INDEPENDENT_AMBULATORY_CARE_PROVIDER_SITE_OTHER)
Admission: EM | Admit: 2013-10-20 | Discharge: 2013-10-20 | Disposition: A | Discharge status: Home / Self Care | Payer: BC Managed Care – PPO | Source: Home / Self Care | Attending: Emergency Medicine | Admitting: Emergency Medicine

## 2013-10-20 DIAGNOSIS — J329 Chronic sinusitis, unspecified: Secondary | ICD-10-CM

## 2013-10-20 DIAGNOSIS — B9789 Other viral agents as the cause of diseases classified elsewhere: Secondary | ICD-10-CM

## 2013-10-20 MED ORDER — DEXAMETHASONE SODIUM PHOSPHATE 10 MG/ML IJ SOLN
INTRAMUSCULAR | Status: AC
  Filled 2013-10-20: qty 1

## 2013-10-20 MED ORDER — FLUTICASONE PROPIONATE 50 MCG/ACT NA SUSP: 2.0000 | Freq: Two times a day (BID) | NASAL | Status: DC

## 2013-10-20 MED ORDER — HYDROCOD POLST-CHLORPHEN POLST 10-8 MG/5ML PO LQCR: 5.0000 mL | Freq: Two times a day (BID) | ORAL | Status: DC | PRN

## 2013-10-20 MED ORDER — IPRATROPIUM BROMIDE 0.06 % NA SOLN: 2.0000 | NASAL | Status: DC | PRN

## 2013-10-20 MED ORDER — PREDNISONE 10 MG PO TABS: ORAL_TABLET | ORAL | Status: DC

## 2013-10-20 MED ORDER — AMOXICILLIN-POT CLAVULANATE 875-125 MG PO TABS: 1.0000 | ORAL_TABLET | Freq: Two times a day (BID) | ORAL | Status: DC

## 2013-10-20 MED ORDER — DEXAMETHASONE SODIUM PHOSPHATE 10 MG/ML IJ SOLN
10.0000 mg | Freq: Once | INTRAMUSCULAR | Status: AC
  Administered 2013-10-20: 10 mg via INTRAMUSCULAR

## 2013-10-20 NOTE — Discharge Instructions (Signed)
Antibiotic Nonuse  Your caregiver felt that the infection or problem was not one that would be helped with an antibiotic. Infections may be caused by viruses or bacteria. Only a caregiver can tell which one of these is the likely cause of an illness. A cold is the most common cause of infection in both adults and children. A cold is a virus. Antibiotic treatment will have no effect on a viral infection. Viruses can lead to many lost days of work caring for sick children and many missed days of school. Children may catch as many as 10 "colds" or "flus" per year during which they can be tearful, cranky, and uncomfortable. The goal of treating a virus is aimed at keeping the ill person comfortable. Antibiotics are medications used to help the body fight bacterial infections. There are relatively few types of bacteria that cause infections but there are hundreds of viruses. While both viruses and bacteria cause infection they are very different types of germs. A viral infection will typically go away by itself within 7 to 10 days. Bacterial infections may spread or get worse without antibiotic treatment. Examples of bacterial infections are:  Sore throats (like strep throat or tonsillitis).  Infection in the lung (pneumonia).  Ear and skin infections. Examples of viral infections are:  Colds or flus.  Most coughs and bronchitis.  Sore throats not caused by Strep.  Runny noses. It is often best not to take an antibiotic when a viral infection is the cause of the problem. Antibiotics can kill off the helpful bacteria that we have inside our body and allow harmful bacteria to start growing. Antibiotics can cause side effects such as allergies, nausea, and diarrhea without helping to improve the symptoms of the viral infection. Additionally, repeated uses of antibiotics can cause bacteria inside of our body to become resistant. That resistance can be passed onto harmful bacterial. The next time you have  an infection it may be harder to treat if antibiotics are used when they are not needed. Not treating with antibiotics allows our own immune system to develop and take care of infections more efficiently. Also, antibiotics will work better for us when they are prescribed for bacterial infections. Treatments for a child that is ill may include:  Give extra fluids throughout the day to stay hydrated.  Get plenty of rest.  Only give your child over-the-counter or prescription medicines for pain, discomfort, or fever as directed by your caregiver.  The use of a cool mist humidifier may help stuffy noses.  Cold medications if suggested by your caregiver. Your caregiver may decide to start you on an antibiotic if:  The problem you were seen for today continues for a longer length of time than expected.  You develop a secondary bacterial infection. SEEK MEDICAL CARE IF:  Fever lasts longer than 5 days.  Symptoms continue to get worse after 5 to 7 days or become severe.  Difficulty in breathing develops.  Signs of dehydration develop (poor drinking, rare urinating, dark colored urine).  Changes in behavior or worsening tiredness (listlessness or lethargy). Document Released: 07/03/2001 Document Revised: 07/17/2011 Document Reviewed: 12/30/2008 [facility] Patient Information 2014 [place], [place].  Sinusitis Sinusitis is redness, soreness, and swelling (inflammation) of the paranasal sinuses. Paranasal sinuses are air pockets within the bones of your face (beneath the eyes, the middle of the forehead, or above the eyes). In healthy paranasal sinuses, mucus is able to drain out, and air is able to circulate through them by way  of your nose. However, when your paranasal sinuses are inflamed, mucus and air can become trapped. This can allow bacteria and other germs to grow and cause infection. Sinusitis can develop quickly and last only a short time (acute) or continue over a long period (chronic).  Sinusitis that lasts for more than 12 weeks is considered chronic.  CAUSES  Causes of sinusitis include:  Allergies.  Structural abnormalities, such as displacement of the cartilage that separates your nostrils (deviated septum), which can decrease the air flow through your nose and sinuses and affect sinus drainage.  Functional abnormalities, such as when the small hairs (cilia) that line your sinuses and help remove mucus do not work properly or are not present. SYMPTOMS  Symptoms of acute and chronic sinusitis are the same. The primary symptoms are pain and pressure around the affected sinuses. Other symptoms include:  Upper toothache.  Earache.  Headache.  Bad breath.  Decreased sense of smell and taste.  A cough, which worsens when you are lying flat.  Fatigue.  Fever.  Thick drainage from your nose, which often is green and may contain pus (purulent).  Swelling and warmth over the affected sinuses. DIAGNOSIS  Your caregiver will perform a physical exam. During the exam, your caregiver may:  Look in your nose for signs of abnormal growths in your nostrils (nasal polyps).  Tap over the affected sinus to check for signs of infection.  View the inside of your sinuses (endoscopy) with a special imaging device with a light attached (endoscope), which is inserted into your sinuses. If your caregiver suspects that you have chronic sinusitis, one or more of the following tests may be recommended:  Allergy tests.  Nasal culture A sample of mucus is taken from your nose and sent to a lab and screened for bacteria.  Nasal cytology A sample of mucus is taken from your nose and examined by your caregiver to determine if your sinusitis is related to an allergy. TREATMENT  Most cases of acute sinusitis are related to a viral infection and will resolve on their own within 10 days. Sometimes medicines are prescribed to help relieve symptoms (pain medicine, decongestants, nasal  steroid sprays, or saline sprays).  However, for sinusitis related to a bacterial infection, your caregiver will prescribe antibiotic medicines. These are medicines that will help kill the bacteria causing the infection.  Rarely, sinusitis is caused by a fungal infection. In theses cases, your caregiver will prescribe antifungal medicine. For some cases of chronic sinusitis, surgery is needed. Generally, these are cases in which sinusitis recurs more than 3 times per year, despite other treatments. HOME CARE INSTRUCTIONS   Drink plenty of water. Water helps thin the mucus so your sinuses can drain more easily.  Use a humidifier.  Inhale steam 3 to 4 times a day (for example, sit in the bathroom with the shower running).  Apply a warm, moist washcloth to your face 3 to 4 times a day, or as directed by your caregiver.  Use saline nasal sprays to help moisten and clean your sinuses.  Take over-the-counter or prescription medicines for pain, discomfort, or fever only as directed by your caregiver. SEEK IMMEDIATE MEDICAL CARE IF:  You have increasing pain or severe headaches.  You have nausea, vomiting, or drowsiness.  You have swelling around your face.  You have vision problems.  You have a stiff neck.  You have difficulty breathing. MAKE SURE YOU:   Understand these instructions.  Will watch your condition.  Will get help right away if you are not doing well or get worse. Document Released: 04/24/2005 Document Revised: 07/17/2011 Document Reviewed: 05/09/2011 [facility] Patient Information 2014 [place], [place].

## 2013-10-20 NOTE — ED Provider Notes (Signed)
CSN: [id number]     Arrival date & time 10/20/13  0808 History   First MD Initiated Contact with Patient 10/20/13 [phone]     Chief Complaint  Patient presents with  . Recurrent Sinusitis   (Consider location/radiation/quality/duration/timing/severity/associated sxs/prior Treatment) HPI Comments: 53 year old male presents for evaluation of recurrent sinus infection. He had a sinus infection about one month ago, this got better with Levaquin, prednisone, and Flonase. He was fine until 3 days ago when he started to have worsening postnasal drip, cough, and fatigue. These symptoms have been constant and worsening. The cough is worse when he lays down at night because he feels like this makes the postnasal drip worse. He denies fever, chills, vomiting, diarrhea, abdominal pain, shortness of breath.   Past Medical History  Diagnosis Date  . Hyperlipidemia   . History of low back pain   . Hypertension, essential   . Scrotal lesion    Past Surgical History  Procedure Laterality Date  . Knee arthroscopy Left 2000  . Wrist ganglion excision Left 2002  . Excision epidermoid cyst, abdominal wall  05-19-2003  . Scrotal exploration N/A 12/17/2012    Procedure: EXCISION OF SCROTAL LESION;  Surgeon: Martina SinnerScott A MacDiarmid, MD;  [facility];  Service: Urology;  Laterality: N/A;   Family History  Problem Relation Age of Onset  . Cancer Sister     Breast Cancer and with non-colon cancer  . Diabetes Other   . Hypertension Other   . Arthritis Other   . Allergies Other    History  Substance Use Topics  . Smoking status: Former Smoker -- 0.50 packs/day for 6 years    Types: Cigarettes    Quit date: 12/13/1992  . Smokeless tobacco: Never Used  . Alcohol Use: Yes     Comment: 0CCASIONAL    Review of Systems  Constitutional: Positive for fatigue. Negative for fever and chills.  HENT: Positive for congestion, postnasal drip, rhinorrhea, sinus pressure and sore throat (from  coughing). Negative for ear pain.   Respiratory: Positive for cough. Negative for shortness of breath.   Cardiovascular: Negative for chest pain.  Gastrointestinal: Positive for nausea (From postnasal drainage). Negative for vomiting and diarrhea.  All other systems reviewed and are negative.   Allergies  Review of patient's allergies indicates no known allergies.  Home Medications   Prior to Admission medications   Medication Sig Start Date End Date Taking? Authorizing Provider  amLODipine (NORVASC) 5 MG tablet Take 1 tablet (5 mg total) by mouth every evening. 07/18/13   Corwin LevinsJames W John, MD  amoxicillin-clavulanate (AUGMENTIN) 875-125 MG per tablet Take 1 tablet by mouth every 12 (twelve) hours. 10/20/13   Graylon GoodZachary H Nashon Erbes, PA-C  atorvastatin (LIPITOR) 40 MG tablet TAKE 1 TABLET BY MOUTH EVERY DAY 09/02/13   Corwin LevinsJames W John, MD  chlorpheniramine-HYDROcodone [facility](TUSSIONEX PENNKINETIC ER) 10-8 MG/5ML LQCR Take 5 mLs by mouth every 12 (twelve) hours as needed for cough. 10/20/13   Graylon GoodZachary H Damel Querry, PA-C  Diclofenac Sodium (PENNSAID) 2 % SOLN Place 2 application onto the skin 2 (two) times daily. 09/11/13   Judi SaaZachary M Smith, DO  fluticasone (FLONASE) 50 MCG/ACT nasal spray Place 2 sprays into both nostrils 2 (two) times daily. Decrease to 2 sprays/nostril daily after 5 days 10/20/13   Graylon GoodZachary H Saavi Mceachron, PA-C  ipratropium (ATROVENT) 0.06 % nasal spray Place 2 sprays into both nostrils every 4 (four) hours as needed for rhinitis. 10/20/13   Graylon GoodZachary H Liora Myles, PA-C  levofloxacin [facility](LEVAQUIN)  250 MG tablet Take 1 tablet (250 mg total) by mouth daily. 09/24/13   Corwin Levins, MD  lisinopril (PRINIVIL,ZESTRIL) 40 MG tablet Take 1 tablet (40 mg total) by mouth every evening. 07/18/13   Corwin Levins, MD  Multiple Vitamin (MULTIVITAMIN) tablet Take 1 tablet by mouth daily.    Historical Provider, MD  naproxen (NAPROSYN) 500 MG tablet Take 1 tablet (500 mg total) by mouth 2 (two) times daily with a meal. As needed for pain 07/18/13    Corwin Levins, MD  predniSONE (DELTASONE) 10 MG tablet 3 tabs by mouth per day for 3 days,2tabs per day for 3 days,1tab per day for 3 days 09/24/13   Corwin Levins, MD  predniSONE (DELTASONE) 10 MG tablet 4 tabs PO QD for 4 days; 3 tabs PO QD for 3 days; 2 tabs PO QD for 2 days; 1 tab PO QD for 1 day 10/20/13   Graylon Good, PA-C  tadalafil (CIALIS) 20 MG tablet Take 10 mg by mouth daily as needed. TAKE 1 TABLET (20 MG TOTAL) BY MOUTH DAILY AS NEEDED FOR ERECTILE DYSFUNCTION. 12/10/12   Corwin Levins, MD  traMADol (ULTRAM) 50 MG tablet Take 1 tablet (50 mg total) by mouth every 8 (eight) hours as needed. 09/11/13   Judi Saa, DO   BP 147/65  Pulse 85  Temp(Src) 98.9 F (37.2 C) (Oral)  Resp 14  SpO2 95% Physical Exam  Nursing note and vitals reviewed. Constitutional: He is oriented to person, place, and time. He appears well-developed and well-nourished. No distress.  HENT:  Head: Normocephalic and atraumatic.  Right Ear: External ear normal.  Left Ear: External ear normal.  Nose: Mucosal edema present. Right sinus exhibits no maxillary sinus tenderness and no frontal sinus tenderness. Left sinus exhibits no maxillary sinus tenderness and no frontal sinus tenderness.  Mouth/Throat: Uvula is midline, oropharynx is clear and moist and mucous membranes are normal. No oropharyngeal exudate.  Eyes: Conjunctivae are normal. Right eye exhibits no discharge. Left eye exhibits no discharge.  Neck: Normal range of motion. Neck supple.  Cardiovascular: Normal rate, regular rhythm and normal heart sounds.   Pulmonary/Chest: Effort normal and breath sounds normal. No respiratory distress.  Lymphadenopathy:    He has no cervical adenopathy.  Neurological: He is alert and oriented to person, place, and time. Coordination normal.  Skin: Skin is warm and dry. No rash noted. He is not diaphoretic.  Psychiatric: He has a normal mood and affect. Judgment normal.    ED Course  Procedures (including  critical care time) Labs Review Labs Reviewed - No data to display  Imaging Review No results found.   MDM   1. Viral sinusitis    Exam is normal, vitals are normal, most likely viral infection. Treat symptomatically. Postdated prescription for Augmentin.  Meds ordered this encounter  Medications  . dexamethasone (DECADRON) injection 10 mg    Sig:   . predniSONE (DELTASONE) 10 MG tablet    Sig: 4 tabs PO QD for 4 days; 3 tabs PO QD for 3 days; 2 tabs PO QD for 2 days; 1 tab PO QD for 1 day    Dispense:  30 tablet    Refill:  0    Order Specific Question:  Supervising Provider    Answer:  Lorenz Coaster, DAVID C [id number]  . fluticasone (FLONASE) 50 MCG/ACT nasal spray    Sig: Place 2 sprays into both nostrils 2 (two) times daily. Decrease to 2 sprays/nostril  daily after 5 days    Dispense:  16 g    Refill:  2    Order Specific Question:  Supervising Provider    Answer:  Lorenz CoasterKELLER, DAVID C [id number]]  . ipratropium (ATROVENT) 0.06 % nasal spray    Sig: Place 2 sprays into both nostrils every 4 (four) hours as needed for rhinitis.    Dispense:  15 mL    Refill:  1    Order Specific Question:  Supervising Provider    Answer:  Lorenz CoasterKELLER, DAVID C [id number]]  . chlorpheniramine-HYDROcodone (TUSSIONEX PENNKINETIC ER) 10-8 MG/5ML LQCR    Sig: Take 5 mLs by mouth every 12 (twelve) hours as needed for cough.    Dispense:  115 mL    Refill:  0    Order Specific Question:  Supervising Provider    Answer:  Lorenz CoasterKELLER, DAVID C [id number]]  . amoxicillin-clavulanate (AUGMENTIN) 875-125 MG per tablet    Sig: Take 1 tablet by mouth every 12 (twelve) hours.    Dispense:  14 tablet    Refill:  0    Order Specific Question:  Supervising Provider    Answer:  Lorenz CoasterKELLER, DAVID C [[id number]]       Graylon GoodZachary H Sharian Delia, PA-C 10/20/13 [phone]

## 2013-10-20 NOTE — ED Notes (Signed)
Pt c/o recurrent sinus infections current episode onset Friday. Patient reports he has been treated previously with antibiotics and nasal spray. Patient denies SOB or wheezing. Has nasal congestion and drainage in his throat. Patient denies fever. Has been taking OTC cold medicine with no relief. Patient is alert and oriented.

## 2013-10-22 NOTE — ED Provider Notes (Signed)
Medical screening examination/treatment/procedure(s) were performed by non-physician practitioner and as supervising physician I was immediately available for consultation/collaboration.  Leslee Homeavid Zein Helbing, M.D.  Reuben Likesavid C Othello Dickenson, MD 10/22/13 [id number]

## 2013-12-17 ENCOUNTER — Other Ambulatory Visit: Payer: Self-pay | Admitting: Internal Medicine

## 2014-02-05 ENCOUNTER — Other Ambulatory Visit: Payer: Self-pay | Admitting: Family Medicine

## 2014-02-05 NOTE — Telephone Encounter (Signed)
Per dr. Katrinka BlazingSmith, denied tramadol. Pt has not been seen since may 2015 & does not have any future appointments.

## 2014-02-12 ENCOUNTER — Ambulatory Visit (INDEPENDENT_AMBULATORY_CARE_PROVIDER_SITE_OTHER): Payer: BC Managed Care – PPO

## 2014-02-12 ENCOUNTER — Telehealth: Payer: Self-pay

## 2014-02-12 DIAGNOSIS — Z23 Encounter for immunization: Secondary | ICD-10-CM

## 2014-02-12 MED ORDER — TADALAFIL 20 MG PO TABS: ORAL_TABLET | ORAL | Status: DC

## 2014-02-12 NOTE — Telephone Encounter (Signed)
The patient would like to know if Dr. Jenny Reichmann could up the quantity on his Cialis.  States Larena Glassman it was written for #6 and was all he could afford at the time, but has met his deductible now and would like more if possible.  If ok sent to [place]

## 2014-02-12 NOTE — Telephone Encounter (Signed)
Patient informed. 

## 2014-02-12 NOTE — Telephone Encounter (Signed)
Done erx 

## 2014-03-04 ENCOUNTER — Other Ambulatory Visit: Payer: Self-pay

## 2014-03-04 MED ORDER — NAPROXEN 500 MG PO TABS: 500.0000 mg | ORAL_TABLET | Freq: Two times a day (BID) | ORAL | Status: DC

## 2014-03-16 ENCOUNTER — Telehealth: Payer: Self-pay | Admitting: Internal Medicine

## 2014-03-16 NOTE — Telephone Encounter (Signed)
Pt called in and is wanting a referral for a Sleep study.  Told him wasn't sure if he would need to be seen or not.  Told him I would send message to see if it could be done

## 2014-03-17 NOTE — Telephone Encounter (Signed)
I do not normally order these tests  I can refer to pulmonary for evaluation , as they order the tests and prescribe tx if needed, just let me know

## 2014-03-18 NOTE — Telephone Encounter (Signed)
Called cell number left detailed message of PCP instructions and to call back if want referral.

## 2014-03-20 ENCOUNTER — Encounter: Payer: Self-pay | Admitting: Internal Medicine

## 2014-03-23 ENCOUNTER — Encounter: Payer: Self-pay | Admitting: Internal Medicine

## 2014-08-06 ENCOUNTER — Other Ambulatory Visit: Payer: Self-pay | Admitting: Internal Medicine

## 2014-08-10 ENCOUNTER — Telehealth: Payer: Self-pay | Admitting: Internal Medicine

## 2014-08-10 MED ORDER — AMLODIPINE BESYLATE 5 MG PO TABS: ORAL_TABLET | ORAL | Status: DC

## 2014-08-10 MED ORDER — LISINOPRIL 40 MG PO TABS: ORAL_TABLET | ORAL | Status: DC

## 2014-08-10 NOTE — Telephone Encounter (Signed)
rx for   lisinopril (PRINIVIL,ZESTRIL) 40 MG tablet 90 tablet 0 08/07/2014     Sig:  TAKE 1 TABLET BY MOUTH IN THE EVENING EVERY DAY    Class:  Normal    DAW:  No    Comment:  DUE FOR AN OFFICE VISIT WITH DR Jonny RuizJOHN IN MAY    Authorizing Provider:  Corwin LevinsJames W John, MD    Ordering User:  Noreene Larssonndreasa R Andrews, CMA    amLODipine (NORVASC) 5 MG tablet 90 tablet 0 08/07/2014     Sig:  TAKE 1 TABLET BY MOUTH IN THE EVENING EVERY DAY    Class:  Normal    DAW:  No    Comment:  DUE FOR AN OFFICE VISIT WITH DR Jonny RuizJOHN IN MAY    Authorizing Provider:  Corwin LevinsJames W John, MD    Ordering User:  Noreene Larssonndreasa R Andrews, CMA    [facility]. Due to new insurance, [facility]. Please route RX to outpatient pharmacy.

## 2014-08-24 ENCOUNTER — Encounter: Payer: Self-pay | Admitting: Family Medicine

## 2014-08-24 ENCOUNTER — Ambulatory Visit (INDEPENDENT_AMBULATORY_CARE_PROVIDER_SITE_OTHER)
Admission: RE | Admit: 2014-08-24 | Discharge: 2014-08-24 | Disposition: A | Discharge status: Home / Self Care | Payer: 59 | Source: Ambulatory Visit | Attending: Family Medicine | Admitting: Family Medicine

## 2014-08-24 ENCOUNTER — Ambulatory Visit (INDEPENDENT_AMBULATORY_CARE_PROVIDER_SITE_OTHER): Payer: 59 | Admitting: Family Medicine

## 2014-08-24 VITALS — BP 132/86 | HR 95 | Ht 69.0 in | Wt 243.0 lb

## 2014-08-24 DIAGNOSIS — M25562 Pain in left knee: Secondary | ICD-10-CM | POA: Diagnosis not present

## 2014-08-24 DIAGNOSIS — M1710 Unilateral primary osteoarthritis, unspecified knee: Secondary | ICD-10-CM | POA: Diagnosis not present

## 2014-08-24 NOTE — Patient Instructions (Addendum)
Good to see you Lets get new xrays downstairs Ice is your friend Try the new brace Continue to be active but for exercises consider biking and swimming Vitamin D 2000 Iu daily Always wear good shoes and consider orthotics See me again in 2 weeks and we can start orthovisc if needed.

## 2014-08-24 NOTE — Progress Notes (Signed)
Pre visit review using our clinic review tool, if applicable. No additional management support is needed unless otherwise documented below in the visit note. 

## 2014-08-24 NOTE — Assessment & Plan Note (Signed)
Patient was given another steroid injection into his left knee. I'm hoping that this will actually help for quite some time. We discussed icing regimen, home exercises and patient was given a medial unloader brace today. Patient will get new x-rays because it has been some time since we've done this. Patient will come back again in 2 weeks for further evaluation and we'll consider viscous supple mentation.

## 2014-08-24 NOTE — Progress Notes (Signed)
  Tawana ScaleZach Naeem Quillin D.O. [facility] [address] [place], [place] [address] Phone: [phone]([phone] Subjective:     CC: Bilateral knee pain  BMW:[id number]:Subjective Chad PlantsCharles R Murphy is a 54 y.o. male coming in for followup of bilateral knee pain. Patient though has not been seen for almost 1 year. Patient was seen previously and was given steroid injections and knees on separate occasion. Patient states overall he had been doing relatively well but the left knee has started her normal over the course of the last multiple months. Patient states that it is starting to affect his daily activities. Patient states even walking across a parking lot can be severely painful. Patient denies any giving out on him. Patient though states that the pain can even be sore after walking a long amount of time. Denies any nighttime awakening or any radiation down the leg.     Past medical history, social, surgical and family history all reviewed in electronic medical record.   Review of Systems: No headache, visual changes, nausea, vomiting, diarrhea, constipation, dizziness, abdominal pain, skin rash, fevers, chills, night sweats, weight loss, swollen lymph nodes, body aches, joint swelling, muscle aches, chest pain, shortness of breath, mood changes.   Objective Blood pressure 132/86, pulse 95, height 5\' 9"  (1.753 m), weight 243 lb (110.224 kg), SpO2 97 %.  General: No apparent distress alert and oriented x3 mood and affect normal, dressed appropriately.  HEENT: Pupils equal, extraocular movements intact  Respiratory: Patient's speak in full sentences and does not appear short of breath  Cardiovascular: No lower extremity edema, non tender, no erythema  Skin: Warm dry intact with no signs of infection or rash on extremities or on axial skeleton.  Abdomen: Soft nontender  Neuro: Cranial nerves II through XII are intact, neurovascularly intact in all extremities with 2+ DTRs and 2+ pulses.  Lymph: No  lymphadenopathy of posterior or anterior cervical chain or axillae bilaterally.  Gait antalgic gait MSK:  Non tender with full range of motion and good stability and symmetric strength and tone of shoulders, elbows, wrist, hip, and ankles bilaterally.   Foot exam shows severe over pronation and loss of longitudinal arch bilaterally left greater than right. Knee: Left Normal to inspection mild medial osteoarthritic changes Palpation normal with no warmth, does have medial joint line tenderness, but no patellar tenderness, or condyle tenderness. ROM full in flexion and extension and lower leg rotation. Ligaments with solid consistent endpoints including ACL, PCL, LCL, MCL. Negative Mcmurray's, Apley's, and Thessalonian tests. Non painful patellar compression. Patellar glide without crepitus. Patellar and quadriceps tendons unremarkable. Hamstring and quadriceps strength is normal.  Contralateral knee mild medial but still has some mild crepitus with range of motion.  After informed written and verbal consent, patient was seated on exam table. Left knee was prepped with alcohol swab and utilizing anterolateral approach, patient's left knee space was injected with 4:1  marcaine 0.5%: Kenalog 40mg /dL. Patient tolerated the procedure well without immediate complications.   Impression and Recommendations:     This case required medical decision making of moderate complexity.

## 2014-08-26 ENCOUNTER — Telehealth: Payer: Self-pay | Admitting: Family Medicine

## 2014-08-26 NOTE — Telephone Encounter (Signed)
Patient called and needs a smaller knee brace. Please call patient

## 2014-09-07 ENCOUNTER — Ambulatory Visit (INDEPENDENT_AMBULATORY_CARE_PROVIDER_SITE_OTHER): Payer: 59 | Admitting: Family Medicine

## 2014-09-07 ENCOUNTER — Encounter: Payer: Self-pay | Admitting: Family Medicine

## 2014-09-07 VITALS — BP 140/88 | HR 101 | Ht 69.0 in | Wt 242.0 lb

## 2014-09-07 DIAGNOSIS — M1710 Unilateral primary osteoarthritis, unspecified knee: Secondary | ICD-10-CM | POA: Diagnosis not present

## 2014-09-07 NOTE — Progress Notes (Signed)
Pre visit review using our clinic review tool, if applicable. No additional management support is needed unless otherwise documented below in the visit note. 

## 2014-09-07 NOTE — Assessment & Plan Note (Signed)
Patient was started on Orthovisc at this time. We discussed icing protocol, home exercises and continuing the bracing. Patient will continue with topical anti-inflammatories and come back and see me again in 2 weeks for second in a series of 4 injections.

## 2014-09-07 NOTE — Patient Instructions (Signed)
Good to see you Ice is your friend Continue the brace See me again in 2 weeks and enjoy your week off.

## 2014-09-07 NOTE — Progress Notes (Signed)
  Chad ScaleZach Aune Murphy D.O. [facility] [address] [place], [place] [address] Phone: [phone] Subjective:     CC: Bilateral knee pain  VHQ:[id number]:Subjective Chad Murphy is a 54 y.o. male coming in for followup of bilateral knee pain. 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 Patient was given a steroid injection 3 weeks ago. Patient's unfortunate states that this did not seem to help. Patient is becoming very frustrated. Continues to have pain on this left knee. Patient is wearing the brace fairly regularly but continues to have some difficulty with it staying in place. Denies that the knee is ever giving on him.     Past medical history, social, surgical and family history all reviewed in electronic medical record. Past medical history is significant for previous surgery of left knee.  Review of Systems: No headache, visual changes, nausea, vomiting, diarrhea, constipation, dizziness, abdominal pain, skin rash, fevers, chills, night sweats, weight loss, swollen lymph nodes, body aches, joint swelling, muscle aches, chest pain, shortness of breath, mood changes.   Objective Blood pressure 140/88, pulse 101, height 5\' 9"  (1.753 m), weight 242 lb (109.77 kg), SpO2 95 %.  General: No apparent distress alert and oriented x3 mood and affect normal, dressed appropriately.  HEENT: Pupils equal, extraocular movements intact  Respiratory: Patient's speak in full sentences and does not appear short of breath  Cardiovascular: No lower extremity edema, non tender, no erythema  Skin: Warm dry intact with no signs of infection or rash on extremities or on axial skeleton.  Abdomen: Soft nontender  Neuro: Cranial nerves II through XII are intact, neurovascularly intact in all extremities with 2+ DTRs and 2+ pulses.  Lymph: No lymphadenopathy of posterior or anterior cervical chain or axillae bilaterally.  Gait antalgic gait MSK:  Non tender with full range of motion and good  stability and symmetric strength and tone of shoulders, elbows, wrist, hip, and ankles bilaterally.   Foot exam shows severe over pronation and loss of longitudinal arch bilaterally left greater than right. Knee: Left Normal to inspection mild medial osteoarthritic changes Palpation normal with no warmth, does have medial joint line tenderness, but no patellar tenderness, or condyle tenderness. ROM full in flexion and extension and lower leg rotation. Ligaments with solid consistent endpoints including ACL, PCL, LCL, MCL. Negative Mcmurray's, Apley's, and Thessalonian tests. Non painful patellar compression. Patellar glide without crepitus. Patellar and quadriceps tendons unremarkable. Hamstring and quadriceps strength is normal.  Contralateral knee mild medial but still has some mild crepitus with range of motion.  After informed written and verbal consent, patient was seated on exam table. Left knee was prepped with alcohol swab and utilizing anterolateral approach, patient's left knee space was injected with 15 mg/2.5 mL of Orthovisc (sodium hyaluronate) in a prefilled syringe was injected easily into the knee through a 22-gauge needle. Patient tolerated the procedure well without immediate complications.   Impression and Recommendations:     This case required medical decision making of moderate complexity.

## 2014-09-21 ENCOUNTER — Encounter: Payer: Self-pay | Admitting: Family Medicine

## 2014-09-21 ENCOUNTER — Ambulatory Visit (INDEPENDENT_AMBULATORY_CARE_PROVIDER_SITE_OTHER): Payer: 59 | Admitting: Family Medicine

## 2014-09-21 VITALS — BP 138/68 | HR 97 | Ht 69.0 in | Wt 242.0 lb

## 2014-09-21 DIAGNOSIS — M1710 Unilateral primary osteoarthritis, unspecified knee: Secondary | ICD-10-CM | POA: Diagnosis not present

## 2014-09-21 NOTE — Assessment & Plan Note (Signed)
She given a second in a series of injections into the left knee. Patient come back in 1 week for third in a series of 4 injections. We discussed continuing the conservative therapy at this time.

## 2014-09-21 NOTE — Progress Notes (Signed)
Pre visit review using our clinic review tool, if applicable. No additional management support is needed unless otherwise documented below in the visit note. 

## 2014-09-21 NOTE — Progress Notes (Signed)
  Tawana ScaleZach Theodus Ran D.O. [facility] [address] [place], [name] Phone: [phone] Subjective:     CC: Bilateral knee pain  BJY:NWGNFAOZHYHPI:Subjective Chad PlantsCharles R Murphy is a 54 y.o. male coming in for followup of bilateral knee pain. Continued left knee pain.  Patient started on the viscous supplementation at last exam. Patient states that he has not notice any significant improvement. Patient is here for a second in a series of 4 injections. Denies any radiation of pain or any numbness.      Past medical history, social, surgical and family history all reviewed in electronic medical record. Past medical history is significant for previous surgery of left knee.  Review of Systems: No headache, visual changes, nausea, vomiting, diarrhea, constipation, dizziness, abdominal pain, skin rash, fevers, chills, night sweats, weight loss, swollen lymph nodes, body aches, joint swelling, muscle aches, chest pain, shortness of breath, mood changes.   Objective Blood pressure 138/68, pulse 97, height 5\' 9"  (1.753 m), weight 242 lb (109.77 kg), SpO2 97 %.  General: No apparent distress alert and oriented x3 mood and affect normal, dressed appropriately.  HEENT: Pupils equal, extraocular movements intact  Respiratory: Patient's speak in full sentences and does not appear short of breath  Cardiovascular: No lower extremity edema, non tender, no erythema  Skin: Warm dry intact with no signs of infection or rash on extremities or on axial skeleton.  Abdomen: Soft nontender  Neuro: Cranial nerves II through XII are intact, neurovascularly intact in all extremities with 2+ DTRs and 2+ pulses.  Lymph: No lymphadenopathy of posterior or anterior cervical chain or axillae bilaterally.  Gait antalgic gait MSK:  Non tender with full range of motion and good stability and symmetric strength and tone of shoulders, elbows, wrist, hip, and ankles bilaterally.   Foot exam shows severe over pronation and  loss of longitudinal arch bilaterally left greater than right. Knee: Left Normal to inspection mild medial osteoarthritic changes Palpation normal with no warmth, does have medial joint line tenderness, but no patellar tenderness, or condyle tenderness. ROM full in flexion and extension and lower leg rotation. Ligaments with solid consistent endpoints including ACL, PCL, LCL, MCL. Negative Mcmurray's, Apley's, and Thessalonian tests. Non painful patellar compression. Patellar glide without crepitus. Patellar and quadriceps tendons unremarkable. Hamstring and quadriceps strength is normal.  Contralateral knee mild medial but still has some mild crepitus with range of motion.  After informed written and verbal consent, patient was seated on exam table. Left knee was prepped with alcohol swab and utilizing anterolateral approach, patient's left knee space was injected with 15 mg/2.5 mL of Orthovisc (sodium hyaluronate) in a prefilled syringe was injected easily into the knee through a 22-gauge needle. Patient tolerated the procedure well without immediate complications.   Impression and Recommendations:     This case required medical decision making of moderate complexity.

## 2014-09-21 NOTE — Patient Instructions (Addendum)
Good to see you Ice 20 minutes 2 times daily. Usually after activity and before bed. Exercises still See me again next week for #3

## 2014-09-29 ENCOUNTER — Ambulatory Visit (INDEPENDENT_AMBULATORY_CARE_PROVIDER_SITE_OTHER): Payer: 59 | Admitting: Family Medicine

## 2014-09-29 ENCOUNTER — Encounter: Payer: Self-pay | Admitting: Family Medicine

## 2014-09-29 VITALS — BP 136/82 | HR 100 | Ht 69.0 in | Wt 242.0 lb

## 2014-09-29 DIAGNOSIS — M1710 Unilateral primary osteoarthritis, unspecified knee: Secondary | ICD-10-CM

## 2014-09-29 DIAGNOSIS — M1712 Unilateral primary osteoarthritis, left knee: Secondary | ICD-10-CM | POA: Diagnosis not present

## 2014-09-29 NOTE — Assessment & Plan Note (Addendum)
Third injection in series of 4. Patient will come back in 1 week for fourth and final injection.

## 2014-09-29 NOTE — Patient Instructions (Signed)
Good to see you Ice in 6 hours See you next week.

## 2014-09-29 NOTE — Progress Notes (Signed)
Pre visit review using our clinic review tool, if applicable. No additional management support is needed unless otherwise documented below in the visit note. 

## 2014-09-29 NOTE — Progress Notes (Signed)
  Tawana ScaleZach Ruchama Kubicek D.O. [facility] [address] [place], [name] Phone: [phone]([phone] Subjective:     CC: Bilateral knee pain  BJY:NWGNFAOZHYHPI:Subjective Chad PlantsCharles R Murphy is a 54 y.o. male coming in for followup of bilateral knee pain. Continued left knee pain.  Patient started on the viscous supplementation at last exam. Patient states that he has not notice any significant improvement. Patient is here for a thirdin a series of 4 injections. Denies any radiation of pain or any numbness. Moderate improvement since previous injection.     Past medical history, social, surgical and family history all reviewed in electronic medical record. Past medical history is significant for previous surgery of left knee.  Review of Systems: No headache, visual changes, nausea, vomiting, diarrhea, constipation, dizziness, abdominal pain, skin rash, fevers, chills, night sweats, weight loss, swollen lymph nodes, body aches, joint swelling, muscle aches, chest pain, shortness of breath, mood changes.   Objective Blood pressure 136/82, pulse 100, height 5\' 9"  (1.753 m), weight 242 lb (109.77 kg), SpO2 94 %.  General: No apparent distress alert and oriented x3 mood and affect normal, dressed appropriately.  HEENT: Pupils equal, extraocular movements intact  Respiratory: Patient's speak in full sentences and does not appear short of breath  Cardiovascular: No lower extremity edema, non tender, no erythema  Skin: Warm dry intact with no signs of infection or rash on extremities or on axial skeleton.  Abdomen: Soft nontender  Neuro: Cranial nerves II through XII are intact, neurovascularly intact in all extremities with 2+ DTRs and 2+ pulses.  Lymph: No lymphadenopathy of posterior or anterior cervical chain or axillae bilaterally.  Gait antalgic gait MSK:  Non tender with full range of motion and good stability and symmetric strength and tone of shoulders, elbows, wrist, hip, and ankles bilaterally.     Foot exam shows severe over pronation and loss of longitudinal arch bilaterally left greater than right. Knee: Left Normal to inspection mild medial osteoarthritic changes Palpation normal with no warmth, does have medial joint line tenderness, but no patellar tenderness, or condyle tenderness. ROM full in flexion and extension and lower leg rotation. Ligaments with solid consistent endpoints including ACL, PCL, LCL, MCL. Negative Mcmurray's, Apley's, and Thessalonian tests. Non painful patellar compression. Patellar glide without crepitus. Patellar and quadriceps tendons unremarkable. Hamstring and quadriceps strength is normal.  Contralateral knee mild medial but still has some mild crepitus with range of motion.  After informed written and verbal consent, patient was seated on exam table. Left knee was prepped with alcohol swab and utilizing anterolateral approach, patient's left knee space was injected with 15 mg/2.5 mL of Orthovisc (sodium hyaluronate) in a prefilled syringe was injected easily into the knee through a 22-gauge needle. Patient tolerated the procedure well without immediate complications.   Impression and Recommendations:     This case required medical decision making of moderate complexity.

## 2014-10-02 ENCOUNTER — Encounter: Payer: Self-pay | Admitting: Physical Therapy

## 2014-10-02 ENCOUNTER — Ambulatory Visit: Payer: 59 | Attending: Orthopedic Surgery | Admitting: Physical Therapy

## 2014-10-02 DIAGNOSIS — M25571 Pain in right ankle and joints of right foot: Secondary | ICD-10-CM | POA: Diagnosis not present

## 2014-10-02 DIAGNOSIS — R262 Difficulty in walking, not elsewhere classified: Secondary | ICD-10-CM | POA: Insufficient documentation

## 2014-10-02 NOTE — Therapy (Signed)
[facility] [address] [place], [place], [address] Phone: [phone]   Fax:  [phone]  Physical Therapy Evaluation  Patient Details  Name: Chad Murphy MRN: [id number] Date of Birth: 1961/04/18 Referring Provider:  Toni Arthurs, MD  Encounter Date: 10/02/2014      PT End of Session - 10/02/14 1056    Visit Number 1   Date for PT Re-Evaluation 12/02/14   PT Start Time 1007   PT Stop Time 1057   PT Time Calculation (min) 50 min   Activity Tolerance Patient tolerated treatment well      Past Medical History  Diagnosis Date  . Hyperlipidemia   . History of low back pain   . Hypertension, essential   . Scrotal lesion     Past Surgical History  Procedure Laterality Date  . Knee arthroscopy Left 2000  . Wrist ganglion excision Left 2002  . Excision epidermoid cyst, abdominal wall  05-19-2003  . Scrotal exploration N/A 12/17/2012    Procedure: EXCISION OF SCROTAL LESION;  Surgeon: Martina Sinner, MD;  Location: [facility];  Service: Urology;  Laterality: N/A;    There were no vitals filed for this visit.  Visit Diagnosis:  Right ankle pain - Plan: PT plan of care cert/re-cert  Difficulty walking - Plan: PT plan of care cert/re-cert      Subjective Assessment - 10/02/14 1013    Subjective Right ankle reconstructive surgery November 2014.  Has plate and screws.  Reports that pain never really went away.  Reports that about two months ago he started having increased pain and throobbing in the ankle.     Limitations Lifting;Standing;Walking;House hold activities   How long can you stand comfortably? 15-20   How long can you walk comfortably? 15   Diagnostic tests recent x-rays are normal   Patient Stated Goals be able to walk without pain   Currently in Pain? Yes   Pain Score 8   pain an 8/10 with walking or standing, at rest pain will recede to 2-3/10   Pain Location Ankle   Pain Orientation Right   Pain Descriptors / Indicators Aching;Sharp   Pain Type Chronic pain   Pain Onset More than a month ago   Pain Frequency Intermittent   Aggravating Factors  walking and standing will increase pain to 8-9/10   Pain Relieving Factors rest   Effect of Pain on Daily Activities can't walk or stand long            [facility] PT Assessment - 10/02/14 0001    Assessment   Medical Diagnosis right posterior tibialis tendonitis   Onset Date/Surgical Date 08/02/14   Prior Therapy no   Precautions   Precautions None   Balance Screen   Has the patient fallen in the past 6 months No   Has the patient had a decrease in activity level because of a fear of falling?  No   Is the patient reluctant to leave their home because of a fear of falling?  No   Home Environment   Living Environment Private residence   Additional Comments has stairs, does some housework and yardwork   Prior Function   Level of Independence Manufacturing systems engineer   Leisure no exercise   Posture/Postural Control   Posture Comments significant pronation of the foot, really collapses in with weight bearing.   ROM / Strength   AROM / PROM / Strength --  strength 4+/5 without much pain   AROM   Overall AROM Comments AROM of the right ankle PF 30, DF 7, Inversion 10, and eversion 10   Flexibility   Soft Tissue Assessment /Muscle Length --  tight calf   Palpation   Palpation comment mild tenderness on the right medial foot over the navicular and just posterior, he has some pitting edema in the right lower leg form the foot to the knee   Ambulation/Gait   Gait Comments limps on the right, no device, has more of a limp in the left due to knee pain, some difficulty with stairs                   [facility] Adult PT Treatment/Exercise - 10/02/14 0001    Modalities   Modalities Iontophoresis   Iontophoresis   Type of Iontophoresis Dexamethasone   Location right navicular area   Dose 80mA    Time 4 hour patch                PT Education - 10/02/14 1055    Education provided Yes   Education Details Stretch for soleus and gastroc   Person(s) Educated Patient   Methods Explanation;Handout   Comprehension Verbalized understanding          PT Short Term Goals - 10/02/14 1059    PT SHORT TERM GOAL #1   Title independent with initial HEP   Time 2   Period Weeks   Status New           PT Long Term Goals - 10/02/14 1100    PT LONG TERM GOAL #1   Title increase AROM of DF to 10 degrees   Time 8   Period Weeks   Status New   PT LONG TERM GOAL #2   Title decrease pain 50%   Time 8   Period Weeks   Status New   PT LONG TERM GOAL #3   Title stand 15 minutes without pain >3/10   Time 8   Period Weeks   Status New   PT LONG TERM GOAL #4   Title walk 1000 feet without difficutly in the ankle/foot   Time 8   Period Weeks   Status New               Plan - 10/02/14 1057    Clinical Impression Statement Patient had a right ankle/foot reconstructive surgery in 2014.  He reports that he has been having increased pain over the past 2 months.  Diagnosed with posterior tibialis tendonits.  Pain an 8/10 with standing or walking.  Significant flat foot and worse wtih weight bearing   Pt will benefit from skilled therapeutic intervention in order to improve on the following deficits Abnormal gait;Decreased mobility;Decreased range of motion;Difficulty walking;Increased edema;Pain   Rehab Potential Good   PT Frequency 2x / week   PT Duration 8 weeks   PT Treatment/Interventions Electrical Stimulation;Cryotherapy;Ultrasound;Iontophoresis /ml Dexamethasone;Moist Heat;Functional mobility training;Therapeutic exercise;Balance training;Manual techniques;Patient/family education   PT Next Visit Plan Will work on taping the ankle, start iontophoresis, try stretches   Consulted and Agree with Plan of Care Patient         Problem List Patient Active Problem  List   Diagnosis Date Noted  . Acute sinus infection 09/24/2013  . Primary localized osteoarthrosis, lower leg 07/25/2013  . Impaired glucose tolerance 07/18/2013  . Left knee pain 07/18/2013  . Scrotal mass 12/10/2012  . Thyroid enlarged 06/21/2012  .  Right ankle pain 04/23/2011  . Preventative health care 08/05/2010  . KNEE PAIN, RIGHT, ACUTE 04/16/2009  . Abdominal pain, generalized 06/10/2008  . HYPERLIPIDEMIA 12/27/2006  . OBESITY 12/27/2006  . ERECTILE DYSFUNCTION 12/27/2006  . HYPERTENSION 12/27/2006  . ALLERGIC RHINITIS 12/27/2006  . LOW BACK PAIN 12/27/2006  . GANGLION CYST, HX OF 12/27/2006    Jearld LeschALBRIGHT,Shereka Lafortune W., PT 10/02/2014, 11:03 AM  [facility] [address] [place], [place], [address] Phone: [phone]   Fax:  [phone]

## 2014-10-06 ENCOUNTER — Ambulatory Visit: Payer: 59 | Admitting: Rehabilitation

## 2014-10-06 ENCOUNTER — Encounter: Payer: Self-pay | Admitting: Family Medicine

## 2014-10-06 ENCOUNTER — Ambulatory Visit (INDEPENDENT_AMBULATORY_CARE_PROVIDER_SITE_OTHER): Payer: 59 | Admitting: Family Medicine

## 2014-10-06 VITALS — BP 130/82 | HR 97 | Ht 69.0 in | Wt 242.0 lb

## 2014-10-06 DIAGNOSIS — M1710 Unilateral primary osteoarthritis, unspecified knee: Secondary | ICD-10-CM | POA: Diagnosis not present

## 2014-10-06 DIAGNOSIS — M25571 Pain in right ankle and joints of right foot: Secondary | ICD-10-CM

## 2014-10-06 NOTE — Progress Notes (Signed)
Pre visit review using our clinic review tool, if applicable. No additional management support is needed unless otherwise documented below in the visit note. 

## 2014-10-06 NOTE — Patient Instructions (Signed)
Congrats you get a break from me! Continue all the exercises and icing.  See me again in 4 weeks!

## 2014-10-06 NOTE — Progress Notes (Signed)
  Chad ScaleZach Murphy D.O. [facility] [address] [place], [name] Phone: [phone] Subjective:     CC: Bilateral knee pain  BJY:[id number]:Subjective Chad Murphy is a 54 y.o. male coming in for followup of bilateral knee pain. Continued left knee pain.  Patient started on the viscous supplementation at last exam. Patient states that he has not notice any significant improvement. Patient is here for fourth and final Orthovisc injection today. Patient states that he is improving slowly.     Past medical history, social, surgical and family history all reviewed in electronic medical record. Past medical history is significant for previous surgery of left knee.  Review of Systems: No headache, visual changes, nausea, vomiting, diarrhea, constipation, dizziness, abdominal pain, skin rash, fevers, chills, night sweats, weight loss, swollen lymph nodes, body aches, joint swelling, muscle aches, chest pain, shortness of breath, mood changes.   Objective Blood pressure 130/82, pulse 97, height 5\' 9"  (1.753 m), weight 242 lb (109.77 kg), SpO2 96 %.  General: No apparent distress alert and oriented x3 mood and affect normal, dressed appropriately.  HEENT: Pupils equal, extraocular movements intact  Respiratory: Patient's speak in full sentences and does not appear short of breath  Cardiovascular: No lower extremity edema, non tender, no erythema  Skin: Warm dry intact with no signs of infection or rash on extremities or on axial skeleton.  Abdomen: Soft nontender  Neuro: Cranial nerves II through XII are intact, neurovascularly intact in all extremities with 2+ DTRs and 2+ pulses.  Lymph: No lymphadenopathy of posterior or anterior cervical chain or axillae bilaterally.  Gait antalgic gait MSK:  Non tender with full range of motion and good stability and symmetric strength and tone of shoulders, elbows, wrist, hip, and ankles bilaterally.   Foot exam shows severe over pronation  and loss of longitudinal arch bilaterally left greater than right. Knee: Left Normal to inspection mild medial osteoarthritic changes Palpation normal with no warmth, does have medial joint line tenderness, but no patellar tenderness, or condyle tenderness. ROM full in flexion and extension and lower leg rotation. Ligaments with solid consistent endpoints including ACL, PCL, LCL, MCL. Negative Mcmurray's, Apley's, and Thessalonian tests. Non painful patellar compression. Patellar glide without crepitus. Patellar and quadriceps tendons unremarkable. Hamstring and quadriceps strength is normal.  Contralateral knee mild medial but still has some mild crepitus with range of motion.  After informed written and verbal consent, patient was seated on exam table. Left knee was prepped with alcohol swab and utilizing anterolateral approach, patient's left knee space was injected with 15 mg/2.5 mL of Orthovisc (sodium hyaluronate) in a prefilled syringe was injected easily into the knee through a 22-gauge needle. Patient tolerated the procedure well without immediate complications.   Impression and Recommendations:     This case required medical decision making of moderate complexity.

## 2014-10-06 NOTE — Assessment & Plan Note (Signed)
Patient given fourth and final injection into the left knee today of Orthovisc. Discussed with patient about the home exercises and icing protocol. Patient will follow-up with me in 4 weeks for further evaluation and treatment.

## 2014-10-06 NOTE — Therapy (Signed)
[facility] [address] [place], [place], [address] Phone: [phone]   Fax:  [phone]  Physical Therapy Treatment  Patient Details  Name: Chad Murphy MRN: [id number] Date of Birth: November 23, 1960 Referring Provider:  Toni Arthurs, MD  Encounter Date: 10/06/2014      PT End of Session - 10/06/14 0942    Visit Number 2   PT Start Time 0800   PT Stop Time 0845   PT Time Calculation (min) 45 min      Past Medical History  Diagnosis Date  . Hyperlipidemia   . History of low back pain   . Hypertension, essential   . Scrotal lesion     Past Surgical History  Procedure Laterality Date  . Knee arthroscopy Left 2000  . Wrist ganglion excision Left 2002  . Excision epidermoid cyst, abdominal wall  05-19-2003  . Scrotal exploration N/A 12/17/2012    Procedure: EXCISION OF SCROTAL LESION;  Surgeon: Martina Sinner, MD;  Location: [facility];  Service: Urology;  Laterality: N/A;    There were no vitals filed for this visit.  Visit Diagnosis:  Right ankle pain      Subjective Assessment - 10/06/14 0827    Subjective States could not tell a great difference in pain level after the taping and dex ionto patch.    Resting pain is 1/10 today, but if on feet over 1 hour pain becomes unbearable and must sit down.   Limitations Standing   Pain Score 5    Pain Orientation Right   Pain Descriptors / Indicators Aching;Stabbing   Pain Type Chronic pain   Pain Relieving Factors sitting   Effect of Pain on Daily Activities unable to ambulate for any functional activity over 30 min to 1 hr                         [facility] Adult PT Treatment/Exercise - 10/06/14 0001    Exercises   Exercises --   Other Exercises  Ankle DF stretch with inversion x 1' x 3, w/ EV x 1' x 3;   standing gastroc/soleus stretches x 1' x 3, BAPS x 30 CW/CCW,   Green TB INV/EV x 30 ea   Modalities   Modalities  Iontophoresis;Ultrasound   Ultrasound   Ultrasound Location R posterior tib tendon   Ultrasound Parameters x 1.2 W/cm2 x 10' at 100% duty cycle   Ultrasound Goals Pain   Iontophoresis   Type of Iontophoresis Dexamethasone   Location r navicular   Dose 80mA min   Time 4 hr patch                PT Education - 10/06/14 0941    Education provided Yes          PT Short Term Goals - 10/02/14 1059    PT SHORT TERM GOAL #1   Title independent with initial HEP   Time 2   Period Weeks   Status New           PT Long Term Goals - 10/02/14 1100    PT LONG TERM GOAL #1   Title increase AROM of DF to 10 degrees   Time 8   Period Weeks   Status New   PT LONG TERM GOAL #2   Title decrease pain 50%   Time 8   Period Weeks   Status New   PT  LONG TERM GOAL #3   Title stand 15 minutes without pain >3/10   Time 8   Period Weeks   Status New   PT LONG TERM GOAL #4   Title walk 1000 feet without difficutly in the ankle/foot   Time 8   Period Weeks   Status New               Plan - 10/06/14 [id number]    Clinical Impression Statement Placed a lateral forefoot foam wedge in pt. R shoe under his superfeet insert to see if this reduces pain at PTT.  Reassess this and check for forefoot valgus.   If he does have FFV, then recommend continue with forefoot wedge laterally.   May need a btter piece cut for his shoe.   Rehab Potential Good   PT Next Visit Plan assess Forefoot valgus, possiby redo lateral forefoot wedge, continue stretches and PTT strengthening.   Consulted and Agree with Plan of Care Patient        Problem List Patient Active Problem List   Diagnosis Date Noted  . Acute sinus infection 09/24/2013  . Primary localized osteoarthrosis, lower leg 07/25/2013  . Impaired glucose tolerance 07/18/2013  . Left knee pain 07/18/2013  . Scrotal mass 12/10/2012  . Thyroid enlarged 06/21/2012  . Right ankle pain 04/23/2011  . Preventative health care  08/05/2010  . KNEE PAIN, RIGHT, ACUTE 04/16/2009  . Abdominal pain, generalized 06/10/2008  . HYPERLIPIDEMIA 12/27/2006  . OBESITY 12/27/2006  . ERECTILE DYSFUNCTION 12/27/2006  . HYPERTENSION 12/27/2006  . ALLERGIC RHINITIS 12/27/2006  . LOW BACK PAIN 12/27/2006  . GANGLION CYST, HX OF 12/27/2006    Ria BushSANDERSON,Shatera Rennert , PT   10/06/2014, 9:48 AM  [facility] [address] [place], [place], [address] Phone: [phone]   Fax:  [phone]

## 2014-11-05 ENCOUNTER — Other Ambulatory Visit: Payer: Self-pay | Admitting: Internal Medicine

## 2014-11-05 ENCOUNTER — Telehealth: Payer: Self-pay | Admitting: Internal Medicine

## 2014-11-05 MED ORDER — TADALAFIL 20 MG PO TABS: ORAL_TABLET | ORAL | Status: DC

## 2014-11-05 MED ORDER — ATORVASTATIN CALCIUM 40 MG PO TABS: 40.0000 mg | ORAL_TABLET | Freq: Every day | ORAL | Status: DC

## 2014-11-05 MED ORDER — NAPROXEN 500 MG PO TABS: 500.0000 mg | ORAL_TABLET | Freq: Two times a day (BID) | ORAL | Status: DC

## 2014-11-05 NOTE — Telephone Encounter (Signed)
Patient needs atorvastatin, naproxen and cialis sent to [facility] to get him through until his appointment in July.

## 2014-11-25 ENCOUNTER — Other Ambulatory Visit (INDEPENDENT_AMBULATORY_CARE_PROVIDER_SITE_OTHER): Payer: 59

## 2014-11-25 ENCOUNTER — Ambulatory Visit (INDEPENDENT_AMBULATORY_CARE_PROVIDER_SITE_OTHER): Payer: 59 | Admitting: Internal Medicine

## 2014-11-25 ENCOUNTER — Encounter: Payer: Self-pay | Admitting: Internal Medicine

## 2014-11-25 VITALS — BP 134/82 | HR 90 | Temp 98.8°F | Ht 68.0 in | Wt 242.0 lb

## 2014-11-25 DIAGNOSIS — R7302 Impaired glucose tolerance (oral): Secondary | ICD-10-CM

## 2014-11-25 DIAGNOSIS — Z0000 Encounter for general adult medical examination without abnormal findings: Secondary | ICD-10-CM

## 2014-11-25 DIAGNOSIS — M79645 Pain in left finger(s): Secondary | ICD-10-CM | POA: Insufficient documentation

## 2014-11-25 LAB — LIPID PANEL
CHOL/HDL RATIO: 3
Cholesterol: 152 mg/dL (ref 0–200)
HDL: 54.1 mg/dL (ref >39.00)
LDL CALC: 77 mg/dL (ref 0–99)
NONHDL: 97.9
TRIGLYCERIDES: 107 mg/dL (ref 0.0–149.0)
VLDL: 21.4 mg/dL (ref 0.0–40.0)

## 2014-11-25 LAB — CBC WITH DIFFERENTIAL/PLATELET
BASOS ABS: 0 10*3/uL (ref 0.0–0.1)
Basophils Relative: 0.7 % (ref 0.0–3.0)
EOS ABS: 0.2 10*3/uL (ref 0.0–0.7)
Eosinophils Relative: 2.9 % (ref 0.0–5.0)
HCT: 45.8 % (ref 39.0–52.0)
HEMOGLOBIN: 15.4 g/dL (ref 13.0–17.0)
Lymphocytes Relative: 38.7 % (ref 12.0–46.0)
Lymphs Abs: 2.3 10*3/uL (ref 0.7–4.0)
MCHC: 33.7 g/dL (ref 30.0–36.0)
MCV: 89.4 fl (ref 78.0–100.0)
MONOS PCT: 8.4 % (ref 3.0–12.0)
Monocytes Absolute: 0.5 10*3/uL (ref 0.1–1.0)
NEUTROS PCT: 49.3 % (ref 43.0–77.0)
Neutro Abs: 2.9 10*3/uL (ref 1.4–7.7)
PLATELETS: 203 10*3/uL (ref 150.0–400.0)
RBC: 5.12 Mil/uL (ref 4.22–5.81)
RDW: 14.5 % (ref 11.5–15.5)
WBC: 5.9 10*3/uL (ref 4.0–10.5)

## 2014-11-25 LAB — TSH: TSH: 0.75 u[IU]/mL (ref 0.35–4.50)

## 2014-11-25 LAB — HEPATIC FUNCTION PANEL
ALT: 28 U/L (ref 0–53)
AST: 19 U/L (ref 0–37)
Albumin: 4.2 g/dL (ref 3.5–5.2)
Alkaline Phosphatase: 87 U/L (ref 39–117)
Bilirubin, Direct: 0.1 mg/dL (ref 0.0–0.3)
TOTAL PROTEIN: 6.9 g/dL (ref 6.0–8.3)
Total Bilirubin: 0.6 mg/dL (ref 0.2–1.2)

## 2014-11-25 LAB — BASIC METABOLIC PANEL
BUN: 14 mg/dL (ref 6–23)
CALCIUM: 9.6 mg/dL (ref 8.4–10.5)
CO2: 28 meq/L (ref 19–32)
Chloride: 108 mEq/L (ref 96–112)
Creatinine, Ser: 0.99 mg/dL (ref 0.40–1.50)
GFR: 101.31 mL/min (ref >60.00)
GLUCOSE: 109 mg/dL — AB (ref 70–99)
POTASSIUM: 4 meq/L (ref 3.5–5.1)
Sodium: 143 mEq/L (ref 135–145)

## 2014-11-25 LAB — URINALYSIS, ROUTINE W REFLEX MICROSCOPIC
Bilirubin Urine: NEGATIVE
Ketones, ur: NEGATIVE
LEUKOCYTES UA: NEGATIVE
NITRITE: NEGATIVE
Specific Gravity, Urine: 1.015 (ref 1.000–1.030)
Total Protein, Urine: NEGATIVE
Urine Glucose: NEGATIVE
Urobilinogen, UA: 0.2 (ref 0.0–1.0)
pH: 6 (ref 5.0–8.0)

## 2014-11-25 LAB — PSA: PSA: 1.19 ng/mL (ref 0.10–4.00)

## 2014-11-25 LAB — HEMOGLOBIN A1C: Hgb A1c MFr Bld: 5.3 % (ref 4.6–6.5)

## 2014-11-25 NOTE — Progress Notes (Signed)
Subjective:    Patient ID: Chad Murphy, male    DOB: 1960-09-01, 54 y.o.   MRN: [id number]  HPI  Here for wellness and f/u;  Overall doing ok;  Pt denies Chest pain, worsening SOB, DOE, wheezing, orthopnea, PND, worsening LE edema, palpitations, dizziness or syncope.  Pt denies neurological change such as new headache, facial or extremity weakness.  Pt denies polydipsia, polyuria, or low sugar symptoms. Pt states overall good compliance with treatment and medications, good tolerability, and has been trying to follow appropriate diet.  Pt denies worsening depressive symptoms, suicidal ideation or panic. No fever, night sweats, wt loss, loss of appetite, or other constitutional symptoms.  Pt states good ability with ADL's, has low fall risk, home safety reviewed and adequate, no other significant changes in hearing or vision, and only occasionally active with exercise.   Pt does snore at night, but denies daytime somnolence.  Now with chronic right ankle pain, mild. Did not finish PT, considering going back to ortho, cannot stand for prolonged periods of time, also affected by knee pain though improved with Dr Katrinka BlazingSmith injections.  Also with left hand fifth finger painful trigger like flexion in the AM most days, painful to pop back in place.  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  May need eventual knee replacements.  Pt trying to avoid further surgury since the ankle did not seem to work well.  Past Medical History  Diagnosis Date  . Hyperlipidemia   . History of low back pain   . Hypertension, essential   . Scrotal lesion    Past Surgical History  Procedure Laterality Date  . Knee arthroscopy Left 2000  . Wrist ganglion excision Left 2002  . Excision epidermoid cyst, abdominal wall  05-19-2003  . Scrotal exploration N/A 12/17/2012    Procedure: EXCISION OF SCROTAL LESION;  Surgeon: Martina SinnerScott A MacDiarmid, MD;  [facility];  Service: Urology;  Laterality: N/A;    reports that he quit smoking about 21 years ago. His smoking use included Cigarettes. He has a 3 pack-year smoking history. He has never used smokeless tobacco. He reports that he drinks alcohol. He reports that he does not use illicit drugs. family history includes Allergies in his other; Arthritis in his other; Cancer in his sister; Diabetes in his other; Hypertension in his other. No Known Allergies Current Outpatient Prescriptions on File Prior to Visit  Medication Sig Dispense Refill  . amLODipine (NORVASC) 5 MG tablet TAKE 1 TABLET BY MOUTH IN THE EVENING EVERY DAY 90 tablet 0  . atorvastatin (LIPITOR) 40 MG tablet Take 1 tablet (40 mg total) by mouth daily. 30 tablet 0  . Diclofenac Sodium (PENNSAID) 2 % SOLN Place 2 application onto the skin 2 (two) times daily. 112 g 3  . lisinopril (PRINIVIL,ZESTRIL) 40 MG tablet TAKE 1 TABLET BY MOUTH IN THE EVENING EVERY DAY (DUE FOR AN OFFICE VISIT WITH DR Jonny RuizJOHN IN MAY) 90 tablet 0  . Multiple Vitamin (MULTIVITAMIN) tablet Take 1 tablet by mouth daily.    . naproxen (NAPROSYN) 500 MG tablet Take 1 tablet (500 mg total) by mouth 2 (two) times daily with a meal. As needed for pain 60 tablet 0  . tadalafil (CIALIS) 20 MG tablet TAKE 1 TABLET BY MOUTH DAILY AS NEEDED FOR ERECTILE DYSFUNCTION 10 tablet 0   No current facility-administered medications on file prior to visit.    Review  of Systems Constitutional: Negative for increased diaphoresis, other activity, appetite or siginficant weight change other than noted HENT: Negative for worsening hearing loss, ear pain, facial swelling, mouth sores and neck stiffness.   Eyes: Negative for other worsening pain, redness or visual disturbance.  Respiratory: Negative for shortness of breath and wheezing  Cardiovascular: Negative for chest pain and palpitations.  Gastrointestinal: Negative for diarrhea, blood in stool, abdominal distention or other  pain Genitourinary: Negative for hematuria, flank pain or change in urine volume.  Musculoskeletal: Negative for myalgias or other joint complaints.  Skin: Negative for color change and wound or drainage.  Neurological: Negative for syncope and numbness. other than noted Hematological: Negative for adenopathy. or other swelling Psychiatric/Behavioral: Negative for hallucinations, SI, self-injury, decreased concentration or other worsening agitation.      Objective:   Physical Exam BP 134/82 mmHg  Pulse 90  Temp(Src) 98.8 F (37.1 C) (Oral)  Ht  (1.727 m)  Wt 242 lb (109.77 kg)  BMI 36.80 kg/m2  SpO2 96% VS noted,  Constitutional: Pt is oriented to person, place, and time. Appears well-developed and well-nourished, in no significant distress Head: Normocephalic and atraumatic.  Right Ear: External ear normal.  Left Ear: External ear normal.  Nose: Nose normal.  Mouth/Throat: Oropharynx is clear and moist.  Eyes: Conjunctivae and EOM are normal. Pupils are equal, round, and reactive to light.  Neck: Normal range of motion. Neck supple. No JVD present. No tracheal deviation present or significant neck LA or mass Cardiovascular: Normal rate, regular rhythm, normal heart sounds and intact distal pulses.   Pulmonary/Chest: Effort normal and breath sounds without rales or wheezing  Abdominal: Soft. Bowel sounds are normal. NT. No HSM  Musculoskeletal: Normal range of motion. Exhibits no edema.  Lymphadenopathy:  Has no cervical adenopathy.  Neurological: Pt is alert and oriented to person, place, and time. Pt has normal reflexes. No cranial nerve deficit. Motor grossly intact Skin: Skin is warm and dry. No rash noted.  Psychiatric:  Has normal mood and affect. Behavior is normal.  Left knee with small effusion, mild warmth, + crepitus, reduced ROM Right knee with lesser deg changens, no effusion, FROM       Assessment & Plan:

## 2014-11-25 NOTE — Assessment & Plan Note (Signed)

## 2014-11-25 NOTE — Assessment & Plan Note (Signed)
C/w trigger finger, declines hand surgury referral at this time

## 2014-11-25 NOTE — Assessment & Plan Note (Signed)
stable overall by history and exam, recent data reviewed with pt, and pt to continue medical treatment as before,  to f/u any worsening symptoms or concerns Lab Results  Component Value Date   HGBA1C 5.6 07/17/2013

## 2014-11-25 NOTE — Patient Instructions (Signed)

## 2014-11-25 NOTE — Progress Notes (Signed)
Pre visit review using our clinic review tool, if applicable. No additional management support is needed unless otherwise documented below in the visit note. 

## 2014-11-26 ENCOUNTER — Encounter: Payer: Self-pay | Admitting: Internal Medicine

## 2014-12-18 ENCOUNTER — Telehealth: Payer: Self-pay | Admitting: *Deleted

## 2014-12-18 MED ORDER — LISINOPRIL 40 MG PO TABS: 40.0000 mg | ORAL_TABLET | Freq: Every evening | ORAL | Status: DC

## 2014-12-18 MED ORDER — ATORVASTATIN CALCIUM 40 MG PO TABS: 40.0000 mg | ORAL_TABLET | Freq: Every day | ORAL | Status: DC

## 2014-12-18 MED ORDER — AMLODIPINE BESYLATE 5 MG PO TABS: ORAL_TABLET | ORAL | Status: DC

## 2014-12-18 NOTE — Telephone Encounter (Signed)
Pt is needing his atorvastatin refilled he stated pharmacy has sent request haven't heard back from office. Inform pt per chart no refill request has came through from [facility], but will send updated scriopt. Rx sent electronically...Raechel Chute

## 2015-01-20 ENCOUNTER — Ambulatory Visit (INDEPENDENT_AMBULATORY_CARE_PROVIDER_SITE_OTHER): Payer: 59 | Admitting: Internal Medicine

## 2015-01-20 ENCOUNTER — Encounter: Payer: Self-pay | Admitting: Internal Medicine

## 2015-01-20 VITALS — BP 162/108 | HR 84 | Temp 98.3°F | Ht 68.0 in | Wt 251.0 lb

## 2015-01-20 DIAGNOSIS — J309 Allergic rhinitis, unspecified: Secondary | ICD-10-CM | POA: Diagnosis not present

## 2015-01-20 DIAGNOSIS — I10 Essential (primary) hypertension: Secondary | ICD-10-CM | POA: Diagnosis not present

## 2015-01-20 DIAGNOSIS — R7302 Impaired glucose tolerance (oral): Secondary | ICD-10-CM | POA: Diagnosis not present

## 2015-01-20 DIAGNOSIS — J069 Acute upper respiratory infection, unspecified: Secondary | ICD-10-CM

## 2015-01-20 MED ORDER — AZITHROMYCIN 250 MG PO TABS: ORAL_TABLET | ORAL | Status: DC

## 2015-01-20 MED ORDER — HYDROCODONE-HOMATROPINE 5-1.5 MG/5ML PO SYRP: 5.0000 mL | ORAL_SOLUTION | Freq: Four times a day (QID) | ORAL | Status: DC | PRN

## 2015-01-20 NOTE — Patient Instructions (Signed)
Please take all new medication as prescribed - the antibiotic, cough medicine  Please continue all other medications as before, and refills have been done if requested.  Please have the pharmacy call with any other refills you may need.  Please keep your appointments with your specialists as you may have planned     

## 2015-01-20 NOTE — Progress Notes (Signed)
Pre visit review using our clinic review tool, if applicable. No additional management support is needed unless otherwise documented below in the visit note. 

## 2015-01-20 NOTE — Progress Notes (Signed)
Subjective:    Patient ID: Chad Murphy, male    DOB: March 16, 1961, 54 y.o.   MRN: [id number]  HPI   Here with 2-3 days acute onset fever, facial pain, pressure, headache, general weakness and malaise, and greenish d/c, with mild ST and cough, but pt denies chest pain, wheezing, increased sob or doe, orthopnea, PND, increased LE swelling, palpitations, dizziness or syncope. Pt denies new neurological symptoms such as new headache, or facial or extremity weakness or numbness   Pt denies polydipsia, polyuria,  Does have several wks ongoing nasal allergy symptoms with clearish congestion, itch and sneezing, without fever, pain, ST, cough, swelling or wheezing, but improved with OTC antihists. Past Medical History  Diagnosis Date  . Hyperlipidemia   . History of low back pain   . Hypertension, essential   . Scrotal lesion    Past Surgical History  Procedure Laterality Date  . Knee arthroscopy Left 2000  . Wrist ganglion excision Left 2002  . Excision epidermoid cyst, abdominal wall  05-19-2003  . Scrotal exploration N/A 12/17/2012    Procedure: EXCISION OF SCROTAL LESION;  Surgeon: Martina Sinner, MD;  Location: [facility];  Service: Urology;  Laterality: N/A;    reports that he quit smoking about 22 years ago. His smoking use included Cigarettes. He has a 3 pack-year smoking history. He has never used smokeless tobacco. He reports that he drinks alcohol. He reports that he does not use illicit drugs. family history includes Allergies in his other; Arthritis in his other; Cancer in his sister; Diabetes in his other; Hypertension in his other. No Known Allergies Current Outpatient Prescriptions on File Prior to Visit  Medication Sig Dispense Refill  . amLODipine (NORVASC) 5 MG tablet TAKE 1 TABLET BY MOUTH IN THE EVENING EVERY DAY 90 tablet 3  . atorvastatin (LIPITOR) 40 MG tablet Take 1 tablet (40 mg total) by mouth daily. 90 tablet 3  . Diclofenac Sodium (PENNSAID)  2 % SOLN Place 2 application onto the skin 2 (two) times daily. 112 g 3  . lisinopril (PRINIVIL,ZESTRIL) 40 MG tablet Take 1 tablet (40 mg total) by mouth every evening. 90 tablet 3  . Multiple Vitamin (MULTIVITAMIN) tablet Take 1 tablet by mouth daily.    . naproxen (NAPROSYN) 500 MG tablet Take 1 tablet (500 mg total) by mouth 2 (two) times daily with a meal. As needed for pain 60 tablet 0  . tadalafil (CIALIS) 20 MG tablet TAKE 1 TABLET BY MOUTH DAILY AS NEEDED FOR ERECTILE DYSFUNCTION 10 tablet 0   No current facility-administered medications on file prior to visit.   Review of Systems  Constitutional: Negative for unusual diaphoresis or night sweats HENT: Negative for ringing in ear or discharge Eyes: Negative for double vision or worsening visual disturbance.  Respiratory: Negative for choking and stridor.   Gastrointestinal: Negative for vomiting or other signifcant bowel change Genitourinary: Negative for hematuria or change in urine volume.  Musculoskeletal: Negative for other MSK pain or swelling Skin: Negative for color change and worsening wound.  Neurological: Negative for tremors and numbness other than noted  Psychiatric/Behavioral: Negative for decreased concentration or agitation other than above       Objective:   Physical Exam BP 162/108 mmHg  Pulse 84  Temp(Src) 98.3 F (36.8 C) (Oral)  Ht 5\' 8"  (1.727 m)  Wt 251 lb (113.853 kg)  BMI 38.17 kg/m2  SpO2 97% VS noted, mild ill Constitutional: Pt appears in no significant distress HENT:  Head: NCAT.  Right Ear: External ear normal.  Left Ear: External ear normal.  Eyes: . Pupils are equal, round, and reactive to light. Conjunctivae and EOM are normal Bilat tm's with mild erythema.  Max sinus areas mild tender.  Pharynx with mild erythema, no exudate Neck: Normal range of motion. Neck supple. with bilat tender lyphadenopathy Cardiovascular: Normal rate and regular rhythm.   Pulmonary/Chest: Effort normal and  breath sounds without rales or wheezing.  Neurological: Pt is alert. Not confused , motor grossly intact Skin: Skin is warm. No rash, no LE edema Psychiatric: Pt behavior is normal. No agitation.     Assessment & Plan:

## 2015-01-21 DIAGNOSIS — J069 Acute upper respiratory infection, unspecified: Secondary | ICD-10-CM | POA: Insufficient documentation

## 2015-01-21 NOTE — Assessment & Plan Note (Signed)
stable overall by history and exam, and pt to continue medical treatment as before,  to f/u any worsening symptoms or concerns, to cont otc zyrtec prn

## 2015-01-21 NOTE — Assessment & Plan Note (Signed)
Elevated today, likely situational, o/w stable overall by history and exam, recent data reviewed with pt, and pt to continue medical treatment as before,  to f/u any worsening symptoms or concerns BP Readings from Last 3 Encounters:  01/20/15 162/108  11/25/14 134/82  10/06/14 130/82

## 2015-01-21 NOTE — Assessment & Plan Note (Signed)
Mild to mod, for antibx course,  to f/u any worsening symptoms or concerns 

## 2015-01-21 NOTE — Assessment & Plan Note (Signed)
stable overall by history and exam, recent data reviewed with pt, and pt to continue medical treatment as before,  to f/u any worsening symptoms or concerns Lab Results  Component Value Date   HGBA1C 5.3 11/25/2014

## 2015-02-12 ENCOUNTER — Other Ambulatory Visit: Payer: Self-pay

## 2015-02-12 MED ORDER — TADALAFIL 20 MG PO TABS: ORAL_TABLET | ORAL | Status: DC

## 2015-02-12 MED ORDER — NAPROXEN 500 MG PO TABS: 500.0000 mg | ORAL_TABLET | Freq: Two times a day (BID) | ORAL | Status: DC

## 2015-02-25 ENCOUNTER — Other Ambulatory Visit: Payer: Self-pay | Admitting: *Deleted

## 2015-02-25 MED ORDER — NAPROXEN 500 MG PO TABS: 500.0000 mg | ORAL_TABLET | Freq: Two times a day (BID) | ORAL | Status: DC

## 2015-02-25 MED ORDER — TADALAFIL 20 MG PO TABS: ORAL_TABLET | ORAL | Status: DC

## 2015-02-25 NOTE — Telephone Encounter (Signed)
MD received paper fax pt requesting refill on his Cialis & Naproxen he ok w/11 refills. Faxed paper request back updated epic...Raechel Chute/lmb

## 2015-05-10 MED FILL — LISINOPRIL 40 MG TABLET: 40 | 90 days supply | Qty: 90 | Fill #1

## 2015-05-10 MED FILL — AMLODIPINE BESYLATE 5 MG TA: 5 | 90 days supply | Qty: 90 | Fill #1

## 2015-05-10 MED FILL — NAPROXEN 500 MG TABLET: 500 | 30 days supply | Qty: 60 | Fill #1

## 2015-06-21 MED FILL — CIALIS 20 MG TABLET: 20 | 30 days supply | Qty: 6 | Fill #1

## 2015-06-21 MED FILL — ATORVASTATIN 40 MG TABLET: 40 | 90 days supply | Qty: 90 | Fill #2

## 2015-08-06 ENCOUNTER — Telehealth: Payer: Self-pay

## 2015-08-06 NOTE — Telephone Encounter (Signed)
Spoke to pt and pt declined the flu vaccine.   

## 2015-08-13 MED FILL — LISINOPRIL 40 MG TABLET: 40 | 90 days supply | Qty: 90 | Fill #2

## 2015-08-13 MED FILL — NAPROXEN 500 MG TABLET: 500 | 30 days supply | Qty: 60 | Fill #2

## 2015-08-13 MED FILL — AMLODIPINE BESYLATE 5 MG TA: 5 | 90 days supply | Qty: 90 | Fill #2

## 2015-08-13 MED FILL — CIALIS 20 MG TABLET: 20 | 30 days supply | Qty: 6 | Fill #2

## 2015-09-23 MED FILL — NAPROXEN 500 MG TABLET: 500 | 30 days supply | Qty: 60 | Fill #3

## 2015-09-23 MED FILL — CIALIS 20 MG TABLET: 20 | 30 days supply | Qty: 6 | Fill #3

## 2015-09-23 MED FILL — ATORVASTATIN 40 MG TABLET: 40 | 90 days supply | Qty: 90 | Fill #3

## 2015-11-13 MED FILL — AMLODIPINE BESYLATE 5 MG TA: 5 | 90 days supply | Qty: 90 | Fill #3

## 2015-11-13 MED FILL — LISINOPRIL 40 MG TABLET: 40 | 90 days supply | Qty: 90 | Fill #3

## 2015-11-15 MED FILL — CIALIS 20 MG TABLET: 20 | 30 days supply | Qty: 6 | Fill #4

## 2015-11-30 ENCOUNTER — Encounter: Payer: 59 | Admitting: Internal Medicine

## 2015-12-20 ENCOUNTER — Other Ambulatory Visit: Payer: Self-pay | Admitting: Internal Medicine

## 2015-12-20 MED FILL — NAPROXEN 500 MG TABLET: 500 | 30 days supply | Qty: 60 | Fill #4

## 2015-12-20 MED FILL — CIALIS 20 MG TABLET: 20 | 30 days supply | Qty: 6 | Fill #5

## 2015-12-21 ENCOUNTER — Other Ambulatory Visit (INDEPENDENT_AMBULATORY_CARE_PROVIDER_SITE_OTHER): Payer: 59

## 2015-12-21 ENCOUNTER — Ambulatory Visit (INDEPENDENT_AMBULATORY_CARE_PROVIDER_SITE_OTHER): Payer: 59 | Admitting: Internal Medicine

## 2015-12-21 ENCOUNTER — Encounter: Payer: Self-pay | Admitting: Internal Medicine

## 2015-12-21 VITALS — BP 138/84 | HR 112 | Temp 98.6°F | Resp 20 | Wt 251.0 lb

## 2015-12-21 DIAGNOSIS — R6889 Other general symptoms and signs: Secondary | ICD-10-CM

## 2015-12-21 DIAGNOSIS — R7302 Impaired glucose tolerance (oral): Secondary | ICD-10-CM | POA: Diagnosis not present

## 2015-12-21 DIAGNOSIS — Z1159 Encounter for screening for other viral diseases: Secondary | ICD-10-CM

## 2015-12-21 DIAGNOSIS — I10 Essential (primary) hypertension: Secondary | ICD-10-CM

## 2015-12-21 DIAGNOSIS — J309 Allergic rhinitis, unspecified: Secondary | ICD-10-CM | POA: Diagnosis not present

## 2015-12-21 DIAGNOSIS — E785 Hyperlipidemia, unspecified: Secondary | ICD-10-CM | POA: Diagnosis not present

## 2015-12-21 DIAGNOSIS — Z0001 Encounter for general adult medical examination with abnormal findings: Secondary | ICD-10-CM

## 2015-12-21 LAB — CBC WITH DIFFERENTIAL/PLATELET
BASOS ABS: 0 10*3/uL (ref 0.0–0.1)
Basophils Relative: 0.5 % (ref 0.0–3.0)
Eosinophils Absolute: 0.2 10*3/uL (ref 0.0–0.7)
Eosinophils Relative: 2.2 % (ref 0.0–5.0)
HCT: 48.2 % (ref 39.0–52.0)
HEMOGLOBIN: 16.2 g/dL (ref 13.0–17.0)
LYMPHS ABS: 2.8 10*3/uL (ref 0.7–4.0)
Lymphocytes Relative: 37.8 % (ref 12.0–46.0)
MCHC: 33.6 g/dL (ref 30.0–36.0)
MCV: 88.5 fl (ref 78.0–100.0)
MONO ABS: 0.6 10*3/uL (ref 0.1–1.0)
MONOS PCT: 8.6 % (ref 3.0–12.0)
NEUTROS PCT: 50.9 % (ref 43.0–77.0)
Neutro Abs: 3.7 10*3/uL (ref 1.4–7.7)
Platelets: 219 10*3/uL (ref 150.0–400.0)
RBC: 5.44 Mil/uL (ref 4.22–5.81)
RDW: 15.1 % (ref 11.5–15.5)
WBC: 7.3 10*3/uL (ref 4.0–10.5)

## 2015-12-21 LAB — HEPATIC FUNCTION PANEL
ALBUMIN: 4.5 g/dL (ref 3.5–5.2)
ALT: 39 U/L (ref 0–53)
AST: 22 U/L (ref 0–37)
Alkaline Phosphatase: 97 U/L (ref 39–117)
Bilirubin, Direct: 0.1 mg/dL (ref 0.0–0.3)
TOTAL PROTEIN: 7.5 g/dL (ref 6.0–8.3)
Total Bilirubin: 0.5 mg/dL (ref 0.2–1.2)

## 2015-12-21 LAB — URINALYSIS, ROUTINE W REFLEX MICROSCOPIC
BILIRUBIN URINE: NEGATIVE
Hgb urine dipstick: NEGATIVE
KETONES UR: NEGATIVE
LEUKOCYTES UA: NEGATIVE
NITRITE: NEGATIVE
PH: 5.5 (ref 5.0–8.0)
Specific Gravity, Urine: 1.015 (ref 1.000–1.030)
TOTAL PROTEIN, URINE-UPE24: NEGATIVE
UROBILINOGEN UA: 0.2 (ref 0.0–1.0)
Urine Glucose: NEGATIVE

## 2015-12-21 LAB — HEMOGLOBIN A1C: Hgb A1c MFr Bld: 5.5 % (ref 4.6–6.5)

## 2015-12-21 LAB — LIPID PANEL
CHOL/HDL RATIO: 3
CHOLESTEROL: 170 mg/dL (ref 0–200)
HDL: 51.8 mg/dL (ref >39.00)
LDL CALC: 95 mg/dL (ref 0–99)
NonHDL: 118.35
Triglycerides: 115 mg/dL (ref 0.0–149.0)
VLDL: 23 mg/dL (ref 0.0–40.0)

## 2015-12-21 LAB — BASIC METABOLIC PANEL
BUN: 13 mg/dL (ref 6–23)
CALCIUM: 10.1 mg/dL (ref 8.4–10.5)
CO2: 31 mEq/L (ref 19–32)
Chloride: 106 mEq/L (ref 96–112)
Creatinine, Ser: 1.22 mg/dL (ref 0.40–1.50)
GFR: 79.29 mL/min (ref >60.00)
GLUCOSE: 83 mg/dL (ref 70–99)
POTASSIUM: 4.4 meq/L (ref 3.5–5.1)
SODIUM: 144 meq/L (ref 135–145)

## 2015-12-21 LAB — TSH: TSH: 0.87 u[IU]/mL (ref 0.35–4.50)

## 2015-12-21 LAB — PSA: PSA: 1.66 ng/mL (ref 0.10–4.00)

## 2015-12-21 MED ORDER — ASPIRIN EC 81 MG PO TBEC: 81.0000 mg | DELAYED_RELEASE_TABLET | Freq: Every day | ORAL | 11 refills | Status: AC

## 2015-12-21 MED ORDER — TRIAMCINOLONE ACETONIDE 55 MCG/ACT NA AERO: 2.0000 | INHALATION_SPRAY | Freq: Every day | NASAL | 12 refills | Status: DC

## 2015-12-21 MED FILL — ASPIR-LOW EC 81 MG TABLET: 81 | 90 days supply | Qty: 90 | Fill #0

## 2015-12-21 MED FILL — ATORVASTATIN 40 MG TABLET: 40 | 90 days supply | Qty: 90 | Fill #0

## 2015-12-21 NOTE — Patient Instructions (Signed)
Please take all new medication as prescribed - the nasacort as directed  Please continue all other medications as before, and refills have been done if requested.  Please have the pharmacy call with any other refills you may need.  Please continue your efforts at being more active, low cholesterol diet, and weight control.  You are otherwise up to date with prevention measures today.  Please keep your appointments with your specialists as you may have planned  Please go to the LAB in the Basement (turn left off the elevator) for the tests to be done today  You will be contacted by phone if any changes need to be made immediately.  Otherwise, you will receive a letter about your results with an explanation, but please check with MyChart first.  Please remember to sign up for MyChart if you have not done so, as this will be important to you in the future with finding out test results, communicating by private email, and scheduling acute appointments online when needed.  Please return in 1 year for your yearly visit, or sooner if needed, with Lab testing done 3-5 days before

## 2015-12-21 NOTE — Assessment & Plan Note (Signed)
Lab Results  Component Value Date   LDLCALC 77 11/25/2014   To cont statin, f/u lab

## 2015-12-21 NOTE — Assessment & Plan Note (Signed)
stable overall by history and exam, recent data reviewed with pt, and pt to continue medical treatment as before,  to f/u any worsening symptoms or concerns BP Readings from Last 3 Encounters:  12/21/15 138/84  01/20/15 (!) 162/108  11/25/14 134/82

## 2015-12-21 NOTE — Progress Notes (Signed)
Pre visit review using our clinic review tool, if applicable. No additional management support is needed unless otherwise documented below in the visit note. 

## 2015-12-21 NOTE — Assessment & Plan Note (Signed)
Asympt, for a1c, cont wt loss efforts 

## 2015-12-21 NOTE — Progress Notes (Signed)
Subjective:    Patient ID: Chad Murphy, male    DOB: 1960-09-19, 55 y.o.   MRN: [id number]  HPI Here for wellness and f/u;  Overall doing ok;  Pt denies Chest pain, worsening SOB, DOE, wheezing, orthopnea, PND, worsening LE edema, palpitations, dizziness or syncope.  Pt denies neurological change such as new headache, facial or extremity weakness.  Pt denies polydipsia, polyuria, or low sugar symptoms. Pt states overall good compliance with treatment and medications, good tolerability, and has been trying to follow appropriate diet.  Pt denies worsening depressive symptoms, suicidal ideation or panic. No fever, night sweats, wt loss, loss of appetite, or other constitutional symptoms.  Pt states good ability with ADL's, has low fall risk, home safety reviewed and adequate, no other significant changes in hearing or vision, and only occasionally active with exercise.  Does have several wks ongoing nasal allergy symptoms with clearish congestion, itch and sneezing, without fever, pain, ST, cough, swelling or wheezing. Pt continues to have recurring LBP without change in severity, bowel or bladder change, fever, wt loss,  worsening LE pain/numbness/weakness, gait change or falls.  Has also worsening chronic left knee pain and has been putting of a knee replacement, Just recently s/p right foot surgury and still painful to walk Past Medical History:  Diagnosis Date  . History of low back pain   . Hyperlipidemia   . Hypertension, essential   . Scrotal lesion    Past Surgical History:  Procedure Laterality Date  . EXCISION EPIDERMOID CYST, ABDOMINAL WALL  05-19-2003  . KNEE ARTHROSCOPY Left 2000  . SCROTAL EXPLORATION N/A 12/17/2012   Procedure: EXCISION OF SCROTAL LESION;  Surgeon: Martina SinnerScott A MacDiarmid, MD;  [facility] [place];  Service: Urology;  Laterality: N/A;  . WRIST GANGLION EXCISION Left 2002    reports that he quit smoking about 23 years ago. His smoking use  included Cigarettes. He has a 3.00 pack-year smoking history. He has never used smokeless tobacco. He reports that he drinks alcohol. He reports that he does not use drugs. family history includes Allergies in his other; Arthritis in his other; Cancer in his sister; Diabetes in his other; Hypertension in his other. No Known Allergies Current Outpatient Prescriptions on File Prior to Visit  Medication Sig Dispense Refill  . amLODipine (NORVASC) 5 MG tablet TAKE 1 TABLET BY MOUTH IN THE EVENING EVERY DAY 90 tablet 3  . atorvastatin (LIPITOR) 40 MG tablet TAKE 1 TABLET BY MOUTH ONCE DAILY 90 tablet 3  . Diclofenac Sodium (PENNSAID) 2 % SOLN Place 2 application onto the skin 2 (two) times daily. 112 g 3  . lisinopril (PRINIVIL,ZESTRIL) 40 MG tablet Take 1 tablet (40 mg total) by mouth every evening. 90 tablet 3  . Multiple Vitamin (MULTIVITAMIN) tablet Take 1 tablet by mouth daily.    . naproxen (NAPROSYN) 500 MG tablet Take 1 tablet (500 mg total) by mouth 2 (two) times daily with a meal. As needed for pain 60 tablet 11  . tadalafil (CIALIS) 20 MG tablet TAKE 1 TABLET BY MOUTH DAILY AS NEEDED FOR ERECTILE DYSFUNCTION 10 tablet 11   No current facility-administered medications on file prior to visit.    Review of Systems Constitutional: Negative for increased diaphoresis, or other activity, appetite or siginficant weight change other than noted HENT: Negative for worsening hearing loss, ear pain, facial swelling, mouth sores and neck stiffness.   Eyes: Negative for other worsening pain, redness or visual disturbance.  Respiratory: Negative for  choking or stridor Cardiovascular: Negative for other chest pain and palpitations.  Gastrointestinal: Negative for worsening diarrhea, blood in stool, or abdominal distention Genitourinary: Negative for hematuria, flank pain or change in urine volume.  Musculoskeletal: Negative for myalgias or other joint complaints.  Skin: Negative for other color change  and wound or drainage.  Neurological: Negative for syncope and numbness. other than noted Hematological: Negative for adenopathy. or other swelling Psychiatric/Behavioral: Negative for hallucinations, SI, self-injury, decreased concentration or other worsening agitation.      Objective:   Physical Exam BP 138/84   Pulse (!) 112   Temp 98.6 F (37 C) (Oral)   Resp 20   Wt 251 lb (113.9 kg)   SpO2 96%   BMI 38.16 kg/m  VS noted,  Constitutional: Pt is oriented to person, place, and time. Appears well-developed and well-nourished, in no significant distress Head: Normocephalic and atraumatic  Eyes: Conjunctivae and EOM are normal. Pupils are equal, round, and reactive to light Bilat tm's with mild erythema.  Max sinus areas none tender.  Pharynx with mild erythema, no exudate Right Ear: External ear normal.  Left Ear: External ear normal Nose: Nose normal.  Mouth/Throat: Oropharynx is clear and moist  Neck: Normal range of motion. Neck supple. No JVD present. No tracheal deviation present or significant neck LA or mass Cardiovascular: Normal rate, regular rhythm, normal heart sounds and intact distal pulses.   Pulmonary/Chest: Effort normal and breath sounds without rales or wheezing  Abdominal: Soft. Bowel sounds are normal. NT. No HSM  Musculoskeletal: Normal range of motion. Exhibits no edema Lymphadenopathy: Has no cervical adenopathy.  Neurological: Pt is alert and oriented to person, place, and time. Pt has normal reflexes. No cranial nerve deficit. Motor grossly intact Skin: Skin is warm and dry. No rash noted or new ulcers Psychiatric:  Has normal mood and affect. Behavior is normal.  Left knee with trace effusion, reduced ROM, and medial bony degenerative changes   Lab Results  Component Value Date   WBC 5.9 11/25/2014   HGB 15.4 11/25/2014   HCT 45.8 11/25/2014   PLT 203.0 11/25/2014   GLUCOSE 109 (H) 11/25/2014   CHOL 152 11/25/2014   TRIG 107.0 11/25/2014   HDL  54.10 11/25/2014   LDLDIRECT 144.5 11/28/2007   LDLCALC 77 11/25/2014   ALT 28 11/25/2014   AST 19 11/25/2014   NA 143 11/25/2014   K 4.0 11/25/2014   CL 108 11/25/2014   CREATININE 0.99 11/25/2014   BUN 14 11/25/2014   CO2 28 11/25/2014   TSH 0.75 11/25/2014   PSA 1.19 11/25/2014   HGBA1C 5.3 11/25/2014       Assessment & Plan:

## 2015-12-21 NOTE — Assessment & Plan Note (Signed)

## 2015-12-21 NOTE — Assessment & Plan Note (Addendum)
Mild to mod, for nasacort asd, to f/u any worsening symptoms or concerns In addition to the time spent performing CPE, I spent an additional 25 minutes face to face,in which greater than 50% of this time was spent in counseling and coordination of care for patient's acute illness as documented.

## 2015-12-22 LAB — HEPATITIS C ANTIBODY: HCV AB: NEGATIVE

## 2015-12-24 ENCOUNTER — Encounter: Payer: Self-pay | Admitting: Internal Medicine

## 2016-02-22 ENCOUNTER — Other Ambulatory Visit: Payer: Self-pay | Admitting: Internal Medicine

## 2016-02-22 MED FILL — AMLODIPINE BESYLATE 5 MG TA: 5 | 90 days supply | Qty: 90 | Fill #0

## 2016-02-22 MED FILL — NAPROXEN 500 MG TABLET: 500 | 30 days supply | Qty: 60 | Fill #5

## 2016-02-22 MED FILL — LISINOPRIL 40 MG TABLET: 40 | 90 days supply | Qty: 90 | Fill #0

## 2016-02-22 MED FILL — CIALIS 20 MG TABLET: 20 | 30 days supply | Qty: 6 | Fill #0

## 2016-02-29 ENCOUNTER — Ambulatory Visit (INDEPENDENT_AMBULATORY_CARE_PROVIDER_SITE_OTHER): Payer: 59 | Admitting: Internal Medicine

## 2016-02-29 ENCOUNTER — Encounter: Payer: Self-pay | Admitting: Internal Medicine

## 2016-02-29 VITALS — BP 138/74 | HR 80 | Temp 98.4°F | Resp 96 | Wt 254.0 lb

## 2016-02-29 DIAGNOSIS — I10 Essential (primary) hypertension: Secondary | ICD-10-CM | POA: Diagnosis not present

## 2016-02-29 DIAGNOSIS — Z23 Encounter for immunization: Secondary | ICD-10-CM

## 2016-02-29 DIAGNOSIS — R059 Cough, unspecified: Secondary | ICD-10-CM

## 2016-02-29 DIAGNOSIS — R7302 Impaired glucose tolerance (oral): Secondary | ICD-10-CM

## 2016-02-29 DIAGNOSIS — R05 Cough: Secondary | ICD-10-CM | POA: Diagnosis not present

## 2016-02-29 DIAGNOSIS — J309 Allergic rhinitis, unspecified: Secondary | ICD-10-CM

## 2016-02-29 MED ORDER — METHYLPREDNISOLONE ACETATE 80 MG/ML IJ SUSP
80.0000 mg | Freq: Once | INTRAMUSCULAR | Status: AC
  Administered 2016-02-29: 80 mg via INTRAMUSCULAR

## 2016-02-29 MED ORDER — HYDROCODONE-HOMATROPINE 5-1.5 MG/5ML PO SYRP: 5.0000 mL | ORAL_SOLUTION | Freq: Four times a day (QID) | ORAL | 0 refills | Status: AC | PRN

## 2016-02-29 MED ORDER — PREDNISONE 10 MG PO TABS: ORAL_TABLET | ORAL | 0 refills | Status: DC

## 2016-02-29 MED ORDER — TRIAMCINOLONE ACETONIDE 55 MCG/ACT NA AERO: 2.0000 | INHALATION_SPRAY | Freq: Every day | NASAL | 12 refills | Status: DC

## 2016-02-29 NOTE — Patient Instructions (Signed)
You had the flu shot today  You had the steroid shot today  Please take all new medication as prescribed - the prednisone, cough medicine, and nasacort as directed  Please continue all other medications as before, and refills have been done if requested.  Please have the pharmacy call with any other refills you may need.  Please keep your appointments with your specialists as you may have planned

## 2016-02-29 NOTE — Progress Notes (Signed)
Pre visit review using our clinic review tool, if applicable. No additional management support is needed unless otherwise documented below in the visit note. 

## 2016-03-01 MED FILL — predniSONE 10 MG TABS: 10 | 9 days supply | Qty: 18 | Fill #0

## 2016-03-01 MED FILL — HYDROCODONE-HOMATROPINE SYR: 5-1.5 | 9 days supply | Qty: 180 | Fill #0

## 2016-03-06 DIAGNOSIS — R05 Cough: Secondary | ICD-10-CM | POA: Insufficient documentation

## 2016-03-06 DIAGNOSIS — R059 Cough, unspecified: Secondary | ICD-10-CM | POA: Insufficient documentation

## 2016-03-06 NOTE — Assessment & Plan Note (Signed)
Pt o/w for tx as above, declines cxr at this time,  to f/u any worsening symptoms or concerns

## 2016-03-06 NOTE — Assessment & Plan Note (Signed)
stable overall by history and exam, recent data reviewed with pt, and pt to continue medical treatment as before,  to f/u any worsening symptoms or concerns BP Readings from Last 3 Encounters:  02/29/16 138/74  12/21/15 138/84  01/20/15 (!) 162/108

## 2016-03-06 NOTE — Progress Notes (Signed)
Subjective:    Patient ID: Chad Murphy, male    DOB: [birth year], 55 y.o.   MRN: [id number]  HPI  Here to f/u, traveled last wk, now with c/o 1 wk mild to mod worsening allergy symptoms with clearish congestion, itch and sneezing as well  As scant prod cough that keeps him up at night so cant sleep, but without fever, pain, ST, swelling or wheezing that he is aware.  Does have some mild right lateral sharp intermittent CP only with cough.  Mucinex has helped, nothing else seems to help or make worse.  Pt denies new neurological symptoms such as new headache, or facial or extremity weakness or numbness   Pt denies polydipsia, polyuria Past Medical History:  Diagnosis Date  . History of low back pain   . Hyperlipidemia   . Hypertension, essential   . Scrotal lesion    Past Surgical History:  Procedure Laterality Date  . EXCISION EPIDERMOID CYST, ABDOMINAL WALL  05-19-2003  . KNEE ARTHROSCOPY Left 2000  . SCROTAL EXPLORATION N/A 12/17/2012   Procedure: EXCISION OF SCROTAL LESION;  Surgeon: Martina SinnerScott A MacDiarmid, MD;  [facility] [place];  Service: Urology;  Laterality: N/A;  . WRIST GANGLION EXCISION Left 2002    reports that he quit smoking about 23 years ago. His smoking use included Cigarettes. He has a 3.00 pack-year smoking history. He has never used smokeless tobacco. He reports that he drinks alcohol. He reports that he does not use drugs. family history includes Allergies in his other; Arthritis in his other; Cancer in his sister; Diabetes in his other; Hypertension in his other. No Known Allergies Current Outpatient Prescriptions on File Prior to Visit  Medication Sig Dispense Refill  . amLODipine (NORVASC) 5 MG tablet TAKE 1 TABLET BY MOUTH IN THE EVENING EVERY DAY 90 tablet 3  . aspirin EC 81 MG tablet Take 1 tablet (81 mg total) by mouth daily. 90 tablet 11  . atorvastatin (LIPITOR) 40 MG tablet TAKE 1 TABLET BY MOUTH ONCE DAILY 90 tablet 3  . CIALIS 20 MG  tablet TAKE 1 TABLET BY MOUTH DAILY AS NEEDED FOR ERECTILE DYSFUNCTION 10 tablet 11  . Diclofenac Sodium (PENNSAID) 2 % SOLN Place 2 application onto the skin 2 (two) times daily. 112 g 3  . lisinopril (PRINIVIL,ZESTRIL) 40 MG tablet TAKE 1 TABLET BY MOUTH EVERY EVENING. 90 tablet 3  . Multiple Vitamin (MULTIVITAMIN) tablet Take 1 tablet by mouth daily.    . naproxen (NAPROSYN) 500 MG tablet Take 1 tablet (500 mg total) by mouth 2 (two) times daily with a meal. As needed for pain 60 tablet 11   No current facility-administered medications on file prior to visit.    Review of Systems  Constitutional: Negative for unusual diaphoresis or night sweats HENT: Negative for ear swelling or discharge Eyes: Negative for worsening visual haziness  Respiratory: Negative for choking and stridor.   Gastrointestinal: Negative for distension or worsening eructation Genitourinary: Negative for retention or change in urine volume.  Musculoskeletal: Negative for other MSK pain or swelling Skin: Negative for color change and worsening wound Neurological: Negative for tremors and numbness other than noted  Psychiatric/Behavioral: Negative for decreased concentration or agitation other than above   All other system neg per pt    Objective:   Physical Exam BP 138/74   Pulse 80   Temp 98.4 F (36.9 C) (Oral)   Resp (!) 96   Wt 254 lb (115.2 kg)  BMI 38.62 kg/m  VS noted,  Constitutional: Pt appears in no apparent distress HENT: Head: NCAT.  Right Ear: External ear normal.  Left Ear: External ear normal.  Eyes: . Pupils are equal, round, and reactive to light. Conjunctivae and EOM are normal Bilat tm's with mild erythema.  Max sinus areas non tender.  Pharynx with mild erythema, no exudate Neck: Normal range of motion. Neck supple.  Cardiovascular: Normal rate and regular rhythm.   Pulmonary/Chest: Effort normal and breath sounds mild decreased without rales or wheezing.  Neurological: Pt is alert.  Not confused , motor grossly intact Skin: Skin is warm. No rash, no LE edema Psychiatric: Pt behavior is normal. No agitation.     Assessment & Plan:

## 2016-03-06 NOTE — Assessment & Plan Note (Signed)
Mild to mod seasonal flare assoc with travel, for depomedrol IM 80, predpac asd, continue zyrtec and add nasacort otc,  to f/u any worsening symptoms or concerns

## 2016-03-06 NOTE — Assessment & Plan Note (Signed)
stable overall by history and exam, recent data reviewed with pt, and pt to continue medical treatment as before,  to f/u any worsening symptoms or concerns Lab Results  Component Value Date   HGBA1C 5.5 12/21/2015

## 2016-03-23 MED FILL — ATORVASTATIN 40 MG TABLET: 40 | 90 days supply | Qty: 90 | Fill #1

## 2016-03-23 MED FILL — CIALIS 20 MG TABLET: 20 | 30 days supply | Qty: 6 | Fill #1

## 2016-05-05 ENCOUNTER — Other Ambulatory Visit: Payer: Self-pay | Admitting: Internal Medicine

## 2016-05-05 MED FILL — NAPROXEN 500 MG TABLET: 500 | 30 days supply | Qty: 60 | Fill #0

## 2016-05-05 MED FILL — ASPIR-LOW EC 81 MG TABLET: 81 | 90 days supply | Qty: 90 | Fill #1

## 2016-05-29 MED FILL — LISINOPRIL 40 MG TABLET: 40 | 90 days supply | Qty: 90 | Fill #1

## 2016-05-29 MED FILL — AMLODIPINE BESYLATE 5 MG TA: 5 | 90 days supply | Qty: 90 | Fill #1

## 2016-07-13 MED FILL — ATORVASTATIN 40 MG TABLET: 40 | 90 days supply | Qty: 90 | Fill #2

## 2016-08-24 MED FILL — AMLODIPINE BESYLATE 5 MG TA: 5 | 90 days supply | Qty: 90 | Fill #2

## 2016-08-24 MED FILL — LISINOPRIL 40 MG TABLET: 40 | 90 days supply | Qty: 90 | Fill #2

## 2016-10-12 MED FILL — ATORVASTATIN 40 MG TABLET: 40 | 90 days supply | Qty: 90 | Fill #3

## 2016-12-01 MED FILL — AMLODIPINE BESYLATE 5 MG TA: 5 | 90 days supply | Qty: 90 | Fill #3

## 2016-12-01 MED FILL — LISINOPRIL 40 MG TABLET: 40 | 90 days supply | Qty: 90 | Fill #3

## 2016-12-01 MED FILL — NAPROXEN 500 MG TABLET: 500 | 30 days supply | Qty: 60 | Fill #1

## 2017-01-29 ENCOUNTER — Other Ambulatory Visit: Payer: Self-pay | Admitting: Internal Medicine

## 2017-02-07 MED ORDER — ATORVASTATIN CALCIUM 40 MG PO TABS: 40.0000 mg | ORAL_TABLET | Freq: Every day | ORAL | 0 refills | Status: DC

## 2017-02-07 NOTE — Telephone Encounter (Signed)
Ok for 90 days lipitor - done erx

## 2017-02-07 NOTE — Addendum Note (Signed)
Addended by: Corwin Levins on: 02/07/2017 04:43 PM   Modules accepted: Orders

## 2017-02-07 NOTE — Telephone Encounter (Signed)
Pt called regarding this refill. I told him that it was denied because he needs to come in for a visit. He said that he knew it had been a while since he had been seen but he currently does not have insurance and he cannot afford to pay our of pocket for the visit. He said that he should have it back in November. He is wanting to know if he could have this refilled to get him through until he is able to come in.

## 2017-02-08 NOTE — Telephone Encounter (Addendum)
Spoke with pt to let him know.

## 2017-02-09 MED FILL — ATORVASTATIN 40 MG TABLET: 40 | 90 days supply | Qty: 90 | Fill #0

## 2017-02-09 MED FILL — NAPROXEN 500 MG TABLET: 500 | 30 days supply | Qty: 60 | Fill #2

## 2017-02-28 ENCOUNTER — Other Ambulatory Visit: Payer: Self-pay | Admitting: Internal Medicine

## 2017-03-02 ENCOUNTER — Telehealth: Payer: Self-pay | Admitting: Internal Medicine

## 2017-03-02 MED ORDER — AMLODIPINE BESYLATE 5 MG PO TABS: ORAL_TABLET | ORAL | 0 refills | Status: DC

## 2017-03-02 MED ORDER — LISINOPRIL 40 MG PO TABS: 40.0000 mg | ORAL_TABLET | Freq: Every evening | ORAL | 0 refills | Status: DC

## 2017-03-02 MED FILL — AMLODIPINE BESYLATE 5 MG TA: 5 | 90 days supply | Qty: 90 | Fill #0

## 2017-03-02 MED FILL — LISINOPRIL 40 MG TABLET: 40 | 90 days supply | Qty: 90 | Fill #0

## 2017-03-02 NOTE — Telephone Encounter (Signed)
90 d supply sent on to [facility]

## 2017-03-02 NOTE — Telephone Encounter (Signed)
Patient states he does not have medical insurance right now.  States he will be signing up for insurance in November.  Is requesting Dr. Jonny RuizJohn to send in scripts for [facility]ril and [facility]. Patient states he will make appointment when insurance gets effective.

## 2019-05-21 ENCOUNTER — Encounter: Payer: Self-pay | Admitting: Internal Medicine

## 2019-05-21 ENCOUNTER — Ambulatory Visit (INDEPENDENT_AMBULATORY_CARE_PROVIDER_SITE_OTHER): Payer: 59 | Admitting: Internal Medicine

## 2019-05-21 ENCOUNTER — Other Ambulatory Visit: Payer: Self-pay

## 2019-05-21 DIAGNOSIS — H109 Unspecified conjunctivitis: Secondary | ICD-10-CM | POA: Insufficient documentation

## 2019-05-21 DIAGNOSIS — J329 Chronic sinusitis, unspecified: Secondary | ICD-10-CM | POA: Diagnosis not present

## 2019-05-21 DIAGNOSIS — H1032 Unspecified acute conjunctivitis, left eye: Secondary | ICD-10-CM | POA: Diagnosis not present

## 2019-05-21 MED ORDER — DOXYCYCLINE HYCLATE 100 MG PO TABS: 100.0000 mg | ORAL_TABLET | Freq: Two times a day (BID) | ORAL | 0 refills | Status: DC

## 2019-05-21 MED ORDER — NEOMYCIN-POLYMYXIN-HC 3.5-10000-1 OP SUSP: 3.0000 [drp] | Freq: Four times a day (QID) | OPHTHALMIC | 0 refills | Status: AC

## 2019-05-21 NOTE — Assessment & Plan Note (Signed)
See notes

## 2019-05-21 NOTE — Progress Notes (Signed)
Patient ID: Chad Murphy, male   DOB: 1961/03/21, 59 y.o.   MRN: [id number]  Phone visit  Cumulative time during 7-day interval 13 min, there was not an associated office visit for this concern within a 7 day period.  Verbal consent for services obtained from patient prior to services given.  Names of all persons present for services: Oliver Barre, MD, patient  Chief complaint: sinus and eye pain  History, background, results pertinent:   Here with 2-3 days acute onset fever, facial pain, left eye pain ad redness, pressure, headache, general weakness and malaise, and greenish d/c, with mild ST and cough, but pt denies chest pain, wheezing, increased sob or doe, orthopnea, PND, increased LE swelling, palpitations, dizziness or syncope.  Past Medical History:  Diagnosis Date  . History of low back pain   . Hyperlipidemia   . Hypertension, essential   . Scrotal lesion    No results found for this or any previous visit (from the past 48 hour(s)). Current Outpatient Medications on File Prior to Visit  Medication Sig Dispense Refill  . amLODipine (NORVASC) 5 MG tablet TAKE 1 TABLET BY MOUTH IN THE EVENING EVERY DAY 90 tablet 0  . aspirin EC 81 MG tablet Take 1 tablet (81 mg total) by mouth daily. 90 tablet 11  . atorvastatin (LIPITOR) 40 MG tablet Take 1 tablet (40 mg total) by mouth daily. 90 tablet 0  . CIALIS 20 MG tablet TAKE 1 TABLET BY MOUTH DAILY AS NEEDED FOR ERECTILE DYSFUNCTION 10 tablet 11  . Diclofenac Sodium (PENNSAID) 2 % SOLN Place 2 application onto the skin 2 (two) times daily. 112 g 3  . lisinopril (PRINIVIL,ZESTRIL) 40 MG tablet Take 1 tablet (40 mg total) by mouth every evening. 90 tablet 0  . Multiple Vitamin (MULTIVITAMIN) tablet Take 1 tablet by mouth daily.    . naproxen (NAPROSYN) 500 MG tablet TAKE 1 TABLET BY MOUTH TWICE DAILY WITH A MEAL AS NEEDED FOR PAIN 60 tablet 11  . predniSONE (DELTASONE) 10 MG tablet 3 tabs by mouth per day for 3 days,2tabs per day for  3 days,1tab per day for 3 days 18 tablet 0  . triamcinolone (NASACORT AQ) 55 MCG/ACT AERO nasal inhaler Place 2 sprays into the nose daily. 1 Inhaler 12   No current facility-administered medications on file prior to visit.   Lab Results  Component Value Date   WBC 7.3 12/21/2015   HGB 16.2 12/21/2015   HCT 48.2 12/21/2015   PLT 219.0 12/21/2015   GLUCOSE 83 12/21/2015   CHOL 170 12/21/2015   TRIG 115.0 12/21/2015   HDL 51.80 12/21/2015   LDLDIRECT 144.5 11/28/2007   LDLCALC 95 12/21/2015   ALT 39 12/21/2015   AST 22 12/21/2015   NA 144 12/21/2015   K 4.4 12/21/2015   CL 106 12/21/2015   CREATININE 1.22 12/21/2015   BUN 13 12/21/2015   CO2 31 12/21/2015   TSH 0.87 12/21/2015   PSA 1.66 12/21/2015   HGBA1C 5.5 12/21/2015    A/P/next steps:   Sinusitis - Mild to mod, for antibx course,  to f/u any worsening symptoms or concerns  Left conjunctivitis - Mild to mod, for antibx course,  to f/u any worsening symptoms or concerns  Oliver Barre MD

## 2019-05-21 NOTE — Patient Instructions (Signed)
Please take all new medication as prescribed  You will be sent the work note today

## 2019-08-21 ENCOUNTER — Ambulatory Visit: Payer: 59 | Attending: Internal Medicine

## 2019-08-21 DIAGNOSIS — Z23 Encounter for immunization: Secondary | ICD-10-CM

## 2019-08-21 NOTE — Progress Notes (Signed)
   Covid-19 Vaccination Clinic  Name:  Chad Murphy    MRN: [id number] DOB: 01/06/1961  08/21/2019  [name] was observed post Covid-19 immunization for 15 minutes without incident. He was provided with Vaccine Information Sheet and instruction to access the V-Safe system.   [name] was instructed to call 911 with any severe reactions post vaccine: Marland Kitchen Difficulty breathing  . Swelling of face and throat  . A fast heartbeat  . A bad rash all over body  . Dizziness and weakness   Immunizations Administered    Name Date Dose VIS Date Route   Pfizer COVID-19 Vaccine 08/21/2019  4:54 PM 0.3 mL 04/18/2019 Intramuscular   Manufacturer: [employer], [employer]   Lot: [id number]   NDC: [address]-4

## 2019-09-15 ENCOUNTER — Ambulatory Visit: Payer: 59 | Attending: Internal Medicine

## 2019-09-15 DIAGNOSIS — Z23 Encounter for immunization: Secondary | ICD-10-CM

## 2019-09-15 NOTE — Progress Notes (Signed)
   Covid-19 Vaccination Clinic  Name:  Chad Murphy    MRN: [id number] DOB: [birth year]  09/15/2019  [name] was observed post Covid-19 immunization for 15 minutes without incident. He was provided with Vaccine Information Sheet and instruction to access the V-Safe system.   [name] was instructed to call 911 with any severe reactions post vaccine: Marland Kitchen Difficulty breathing  . Swelling of face and throat  . A fast heartbeat  . A bad rash all over body  . Dizziness and weakness   Immunizations Administered    Name Date Dose VIS Date Route   Pfizer COVID-19 Vaccine 09/15/2019  4:56 PM 0.3 mL 07/02/2018 Intramuscular   Manufacturer: [employer], [employer]   Lot: [id number]   NDC: [address]-3

## 2019-10-16 ENCOUNTER — Other Ambulatory Visit (INDEPENDENT_AMBULATORY_CARE_PROVIDER_SITE_OTHER): Payer: BC Managed Care – PPO

## 2019-10-16 ENCOUNTER — Encounter: Payer: Self-pay | Admitting: Internal Medicine

## 2019-10-16 ENCOUNTER — Ambulatory Visit (INDEPENDENT_AMBULATORY_CARE_PROVIDER_SITE_OTHER): Payer: BC Managed Care – PPO | Admitting: Internal Medicine

## 2019-10-16 ENCOUNTER — Other Ambulatory Visit: Payer: Self-pay

## 2019-10-16 VITALS — BP 182/106 | HR 80 | Temp 98.7°F | Ht 68.0 in | Wt 224.0 lb

## 2019-10-16 DIAGNOSIS — I10 Essential (primary) hypertension: Secondary | ICD-10-CM

## 2019-10-16 DIAGNOSIS — R7302 Impaired glucose tolerance (oral): Secondary | ICD-10-CM

## 2019-10-16 DIAGNOSIS — Z0001 Encounter for general adult medical examination with abnormal findings: Secondary | ICD-10-CM

## 2019-10-16 DIAGNOSIS — M170 Bilateral primary osteoarthritis of knee: Secondary | ICD-10-CM

## 2019-10-16 DIAGNOSIS — Z114 Encounter for screening for human immunodeficiency virus [HIV]: Secondary | ICD-10-CM

## 2019-10-16 DIAGNOSIS — E538 Deficiency of other specified B group vitamins: Secondary | ICD-10-CM | POA: Diagnosis not present

## 2019-10-16 DIAGNOSIS — E785 Hyperlipidemia, unspecified: Secondary | ICD-10-CM

## 2019-10-16 DIAGNOSIS — M1710 Unilateral primary osteoarthritis, unspecified knee: Secondary | ICD-10-CM

## 2019-10-16 DIAGNOSIS — M5432 Sciatica, left side: Secondary | ICD-10-CM | POA: Diagnosis not present

## 2019-10-16 DIAGNOSIS — Z0000 Encounter for general adult medical examination without abnormal findings: Secondary | ICD-10-CM

## 2019-10-16 DIAGNOSIS — E559 Vitamin D deficiency, unspecified: Secondary | ICD-10-CM

## 2019-10-16 LAB — URINALYSIS, ROUTINE W REFLEX MICROSCOPIC
Bilirubin Urine: NEGATIVE
Ketones, ur: NEGATIVE
Leukocytes,Ua: NEGATIVE
Nitrite: NEGATIVE
Specific Gravity, Urine: 1.02 (ref 1.000–1.030)
Total Protein, Urine: NEGATIVE
Urine Glucose: NEGATIVE
Urobilinogen, UA: 0.2 (ref 0.0–1.0)
pH: 6 (ref 5.0–8.0)

## 2019-10-16 LAB — CBC WITH DIFFERENTIAL/PLATELET
Basophils Absolute: 0.1 10*3/uL (ref 0.0–0.1)
Basophils Relative: 1.1 % (ref 0.0–3.0)
Eosinophils Absolute: 0.3 10*3/uL (ref 0.0–0.7)
Eosinophils Relative: 4.1 % (ref 0.0–5.0)
HCT: 42.7 % (ref 39.0–52.0)
Hemoglobin: 14.4 g/dL (ref 13.0–17.0)
Lymphocytes Relative: 43.6 % (ref 12.0–46.0)
Lymphs Abs: 2.9 10*3/uL (ref 0.7–4.0)
MCHC: 33.7 g/dL (ref 30.0–36.0)
MCV: 89.7 fl (ref 78.0–100.0)
Monocytes Absolute: 0.7 10*3/uL (ref 0.1–1.0)
Monocytes Relative: 10.1 % (ref 3.0–12.0)
Neutro Abs: 2.7 10*3/uL (ref 1.4–7.7)
Neutrophils Relative %: 41.1 % — ABNORMAL LOW (ref 43.0–77.0)
Platelets: 176 10*3/uL (ref 150.0–400.0)
RBC: 4.76 Mil/uL (ref 4.22–5.81)
RDW: 14.5 % (ref 11.5–15.5)
WBC: 6.6 10*3/uL (ref 4.0–10.5)

## 2019-10-16 LAB — PSA: PSA: 1.85 ng/mL (ref 0.10–4.00)

## 2019-10-16 LAB — VITAMIN D 25 HYDROXY (VIT D DEFICIENCY, FRACTURES): VITD: 14.97 ng/mL — ABNORMAL LOW (ref 30.00–100.00)

## 2019-10-16 LAB — VITAMIN B12: Vitamin B-12: 247 pg/mL (ref 211–911)

## 2019-10-16 LAB — TSH: TSH: 0.91 u[IU]/mL (ref 0.35–4.50)

## 2019-10-16 MED ORDER — NAPROXEN 500 MG PO TABS: ORAL_TABLET | ORAL | 3 refills | Status: AC

## 2019-10-16 MED ORDER — TRIAMCINOLONE ACETONIDE 55 MCG/ACT NA AERO: 2.0000 | INHALATION_SPRAY | Freq: Every day | NASAL | 12 refills | Status: DC

## 2019-10-16 MED ORDER — AMLODIPINE BESYLATE 5 MG PO TABS: ORAL_TABLET | ORAL | 3 refills | Status: DC

## 2019-10-16 MED ORDER — LISINOPRIL 40 MG PO TABS: 40.0000 mg | ORAL_TABLET | Freq: Every evening | ORAL | 3 refills | Status: DC

## 2019-10-16 MED ORDER — ATORVASTATIN CALCIUM 40 MG PO TABS: 40.0000 mg | ORAL_TABLET | Freq: Every day | ORAL | 3 refills | Status: DC

## 2019-10-16 MED ORDER — TADALAFIL 20 MG PO TABS: ORAL_TABLET | ORAL | 11 refills | Status: DC

## 2019-10-16 MED ORDER — TRAMADOL HCL 50 MG PO TABS: 50.0000 mg | ORAL_TABLET | Freq: Four times a day (QID) | ORAL | 0 refills | Status: DC | PRN

## 2019-10-16 NOTE — Patient Instructions (Signed)
Please take all new medication as prescribed - the pain medication  Please continue all other medications as before, and refills have been done if requested.  Please have the pharmacy call with any other refills you may need.  Please continue your efforts at being more active, low cholesterol diet, and weight control.  You are otherwise up to date with prevention measures today.  Please keep your appointments with your specialists as you may have planned  You will be contacted regarding the referral for: Orthopedic  Please go to the LAB at the blood drawing area for the tests to be done - at the [facility] lab  You will be contacted by phone if any changes need to be made immediately.  Otherwise, you will receive a letter about your results with an explanation, but please check with MyChart first.  Please remember to sign up for MyChart if you have not done so, as this will be important to you in the future with finding out test results, communicating by private email, and scheduling acute appointments online when needed.  Please make an Appointment to return in 3 months, or sooner if needed

## 2019-10-16 NOTE — Progress Notes (Signed)
Subjective:    Patient ID: Chad Murphy, male    DOB: 1961/01/19, 59 y.o.   MRN: [id number]  HPI  Here for wellness and f/u;  Overall doing ok;  Pt denies Chest pain, worsening SOB, DOE, wheezing, orthopnea, PND, worsening LE edema, palpitations, dizziness or syncope.  Pt denies neurological change such as new headache, facial or extremity weakness.  Pt denies polydipsia, polyuria, or low sugar symptoms. Pt states overall good compliance with treatment and medications, good tolerability, and has been trying to follow appropriate diet.  Pt denies worsening depressive symptoms, suicidal ideation or panic. No fever, night sweats, wt loss, loss of appetite, or other constitutional symptoms.  Pt states good ability with ADL's, has low fall risk, home safety reviewed and adequate, no other significant changes in hearing or vision, and only occasionally active with exercise.   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  Also with recurring left lower back pain with sciatica without weakness or numbness.   Past Medical History:  Diagnosis Date  . History of low back pain   . Hyperlipidemia   . Hypertension, essential   . Scrotal lesion    Past Surgical History:  Procedure Laterality Date  . EXCISION EPIDERMOID CYST, ABDOMINAL WALL  05-19-2003  . KNEE ARTHROSCOPY Left 2000  . SCROTAL EXPLORATION N/A 12/17/2012   Procedure: EXCISION OF SCROTAL LESION;  Surgeon: Martina Sinner, MD;  Location: [facility];  Service: Urology;  Laterality: N/A;  . WRIST GANGLION EXCISION Left 2002    reports that he quit smoking about 26 years ago. His smoking use included cigarettes. He has a 3.00 pack-year smoking history. He has never used smokeless tobacco. He reports current alcohol use. He reports that he does not use drugs. family history includes Allergies in an other family member;  Arthritis in an other family member; Cancer in his sister; Diabetes in an other family member; Hypertension in an other family member. No Known Allergies Current Outpatient Medications on File Prior to Visit  Medication Sig Dispense Refill  . aspirin EC 81 MG tablet Take 1 tablet (81 mg total) by mouth daily. 90 tablet 11  . Multiple Vitamin (MULTIVITAMIN) tablet Take 1 tablet by mouth daily.     No current facility-administered medications on file prior to visit.   Review of Systems All otherwise neg per pt    Objective:   Physical Exam BP (!) 182/106 (BP Location: Left Arm, Patient Position: Sitting, Cuff Size: Large)   Pulse 80   Temp 98.7 F (37.1 C) (Oral)   Ht 5\' 8"  (1.727 m)   Wt 224 lb (101.6 kg)   SpO2 95%   BMI 34.06 kg/m  VS noted,  Constitutional: Pt appears in NAD HENT: Head: NCAT.  Right Ear: External ear normal.  Left Ear: External ear normal.  Eyes: . Pupils are equal, round, and reactive to light. Conjunctivae and EOM are normal Nose: without d/c or deformity Neck: Neck supple. Gross normal ROM Cardiovascular: Normal rate and regular rhythm.   Pulmonary/Chest: Effort normal and breath sounds without rales or wheezing.  Abd:  Soft, NT, ND, + BS, no organomegaly Neurological: Pt is alert. At baseline orientation, motor grossly intact Skin: Skin is warm. No rashes, other new lesions, no LE edema Psychiatric: Pt behavior is normal without agitation  All otherwise neg per pt Lab Results  Component Value Date   WBC 6.6  10/16/2019   HGB 14.4 10/16/2019   HCT 42.7 10/16/2019   PLT 176.0 10/16/2019   GLUCOSE 87 10/16/2019   CHOL 227 (H) 10/16/2019   TRIG 121.0 10/16/2019   HDL 56.10 10/16/2019   LDLDIRECT 144.5 11/28/2007   LDLCALC 147 (H) 10/16/2019   ALT 20 10/16/2019   AST 21 10/16/2019   NA 142 10/16/2019   K 3.5 10/16/2019   CL 106 10/16/2019   CREATININE 1.34 10/16/2019   BUN 25 (H) 10/16/2019   CO2 25 10/16/2019   TSH 0.91 10/16/2019   PSA  1.85 10/16/2019   HGBA1C 5.2 10/16/2019      Assessment & Plan:

## 2019-10-17 LAB — LIPID PANEL
Cholesterol: 227 mg/dL — ABNORMAL HIGH (ref 0–200)
HDL: 56.1 mg/dL (ref >39.00)
LDL Cholesterol: 147 mg/dL — ABNORMAL HIGH (ref 0–99)
NonHDL: 171.36
Total CHOL/HDL Ratio: 4
Triglycerides: 121 mg/dL (ref 0.0–149.0)
VLDL: 24.2 mg/dL (ref 0.0–40.0)

## 2019-10-17 LAB — HEMOGLOBIN A1C: Hgb A1c MFr Bld: 5.2 % (ref 4.6–6.5)

## 2019-10-17 LAB — HEPATIC FUNCTION PANEL
ALT: 20 U/L (ref 0–53)
AST: 21 U/L (ref 0–37)
Albumin: 4.3 g/dL (ref 3.5–5.2)
Alkaline Phosphatase: 89 U/L (ref 39–117)
Bilirubin, Direct: 0.1 mg/dL (ref 0.0–0.3)
Total Bilirubin: 0.3 mg/dL (ref 0.2–1.2)
Total Protein: 7.1 g/dL (ref 6.0–8.3)

## 2019-10-17 LAB — BASIC METABOLIC PANEL
BUN: 25 mg/dL — ABNORMAL HIGH (ref 6–23)
CO2: 25 mEq/L (ref 19–32)
Calcium: 9.2 mg/dL (ref 8.4–10.5)
Chloride: 106 mEq/L (ref 96–112)
Creatinine, Ser: 1.34 mg/dL (ref 0.40–1.50)
GFR: 66.04 mL/min (ref >60.00)
Glucose, Bld: 87 mg/dL (ref 70–99)
Potassium: 3.5 mEq/L (ref 3.5–5.1)
Sodium: 142 mEq/L (ref 135–145)

## 2019-10-17 LAB — HIV ANTIBODY (ROUTINE TESTING W REFLEX): HIV 1&2 Ab, 4th Generation: NONREACTIVE

## 2019-10-19 ENCOUNTER — Encounter: Payer: Self-pay | Admitting: Internal Medicine

## 2019-10-19 ENCOUNTER — Other Ambulatory Visit: Payer: Self-pay | Admitting: Internal Medicine

## 2019-10-19 DIAGNOSIS — M5430 Sciatica, unspecified side: Secondary | ICD-10-CM | POA: Insufficient documentation

## 2019-10-19 MED ORDER — VITAMIN D (ERGOCALCIFEROL) 1.25 MG (50000 UNIT) PO CAPS: 50000.0000 [IU] | ORAL_CAPSULE | ORAL | 0 refills | Status: DC

## 2019-10-19 NOTE — Assessment & Plan Note (Signed)

## 2019-10-19 NOTE — Assessment & Plan Note (Signed)
For ortho referral, tylenol prn

## 2019-10-19 NOTE — Assessment & Plan Note (Signed)
stable overall by history and exam, recent data reviewed with pt, and pt to continue medical treatment as before,  to f/u any worsening symptoms or concerns  

## 2019-10-19 NOTE — Assessment & Plan Note (Addendum)
For referral to ortho,  to f/u any worsening symptoms or concerns  I spent 45 minutes in addition to time for CPX wellness examination in preparing to see the patient by review of recent labs, imaging and procedures, obtaining and reviewing separately obtained history, communicating with the patient and family or caregiver, ordering medications, tests or procedures, and documenting clinical information in the EHR including the differential Dx, treatment, and any further evaluation and other management of knee oa, hyperglycemia, htn, hld, sciatica

## 2019-11-14 ENCOUNTER — Telehealth: Payer: BC Managed Care – PPO | Admitting: Internal Medicine

## 2020-01-06 ENCOUNTER — Other Ambulatory Visit: Payer: Self-pay | Admitting: Internal Medicine

## 2020-01-06 NOTE — Telephone Encounter (Signed)
Please change to OTC Vitamin D3 at 2000 units per day, indefinitely.  

## 2020-01-16 ENCOUNTER — Other Ambulatory Visit: Payer: Self-pay

## 2020-01-16 ENCOUNTER — Encounter: Payer: Self-pay | Admitting: Internal Medicine

## 2020-01-16 ENCOUNTER — Ambulatory Visit: Payer: BC Managed Care – PPO | Admitting: Internal Medicine

## 2020-01-16 VITALS — BP 170/100 | HR 89 | Temp 98.6°F | Ht 68.0 in | Wt 222.0 lb

## 2020-01-16 DIAGNOSIS — E785 Hyperlipidemia, unspecified: Secondary | ICD-10-CM

## 2020-01-16 DIAGNOSIS — R7302 Impaired glucose tolerance (oral): Secondary | ICD-10-CM

## 2020-01-16 DIAGNOSIS — I10 Essential (primary) hypertension: Secondary | ICD-10-CM

## 2020-01-16 LAB — POCT GLYCOSYLATED HEMOGLOBIN (HGB A1C): Hemoglobin A1C: 5.5 % (ref 4.0–5.6)

## 2020-01-16 MED ORDER — AMLODIPINE BESYLATE 10 MG PO TABS: 10.0000 mg | ORAL_TABLET | Freq: Every day | ORAL | 3 refills | Status: DC

## 2020-01-16 MED ORDER — HYDROCHLOROTHIAZIDE 25 MG PO TABS
25.0000 mg | ORAL_TABLET | Freq: Every day | ORAL | 3 refills | Status: DC
Start: 2020-01-16 — End: 2020-10-18

## 2020-01-16 NOTE — Progress Notes (Signed)
Subjective:    Patient ID: Chad Murphy, male    DOB: Jun 22, 1960, 59 y.o.   MRN: [id number]  HPI  Here to f/u; overall doing ok,  Pt denies chest pain, increasing sob or doe, wheezing, orthopnea, PND, increased LE swelling, palpitations, dizziness or syncope.  Pt denies new neurological symptoms such as new headache, or facial or extremity weakness or numbness.  Pt denies polydipsia, polyuria, or low sugar episode.  Pt states overall good compliance with meds, mostly trying to follow appropriate diet, with wt overall stable,  but little exercise however.   Past Medical History:  Diagnosis Date  . History of low back pain   . Hyperlipidemia   . Hypertension, essential   . Scrotal lesion    Past Surgical History:  Procedure Laterality Date  . EXCISION EPIDERMOID CYST, ABDOMINAL WALL  05-19-2003  . KNEE ARTHROSCOPY Left 2000  . SCROTAL EXPLORATION N/A 12/17/2012   Procedure: EXCISION OF SCROTAL LESION;  Surgeon: Martina Sinner, MD;  Location: [facility];  Service: Urology;  Laterality: N/A;  . WRIST GANGLION EXCISION Left 2002    reports that he quit smoking about 27 years ago. His smoking use included cigarettes. He has a 3.00 pack-year smoking history. He has never used smokeless tobacco. He reports current alcohol use. He reports that he does not use drugs. family history includes Allergies in an other family member; Arthritis in an other family member; Cancer in his sister; Diabetes in an other family member; Hypertension in an other family member. No Known Allergies Current Outpatient Medications on File Prior to Visit  Medication Sig Dispense Refill  . aspirin EC 81 MG tablet Take 1 tablet (81 mg total) by mouth daily. 90 tablet 11  . atorvastatin (LIPITOR) 40 MG tablet Take 1 tablet (40 mg total) by mouth daily. 90 tablet 3  . lisinopril (ZESTRIL) 40 MG tablet Take 1 tablet (40 mg total) by mouth every evening. 90 tablet 3  . Multiple Vitamin  (MULTIVITAMIN) tablet Take 1 tablet by mouth daily.    . naproxen (NAPROSYN) 500 MG tablet TAKE 1 TABLET BY MOUTH TWICE DAILY WITH A MEAL AS NEEDED FOR PAIN 60 tablet 3  . tadalafil (CIALIS) 20 MG tablet TAKE 1 TABLET BY MOUTH DAILY AS NEEDED FOR ERECTILE DYSFUNCTION 10 tablet 11  . traMADol (ULTRAM) 50 MG tablet Take 1 tablet (50 mg total) by mouth every 6 (six) hours as needed. 30 tablet 0  . triamcinolone (NASACORT AQ) 55 MCG/ACT AERO nasal inhaler Place 2 sprays into the nose daily. 1 Inhaler 12  . Vitamin D, Ergocalciferol, (DRISDOL) 1.25 MG (50000 UNIT) CAPS capsule Take 1 capsule (50,000 Units total) by mouth every 7 (seven) days. 12 capsule 0   No current facility-administered medications on file prior to visit.   Review of Systems All otherwise neg per pt     Objective:   Physical Exam BP (!) 170/100 (BP Location: Left Arm, Patient Position: Sitting, Cuff Size: Large)   Pulse 89   Temp 98.6 F (37 C) (Oral)   Ht 5\' 8"  (1.727 m)   Wt 222 lb (100.7 kg)   SpO2 97%   BMI 33.75 kg/m  VS noted,  Constitutional: Pt appears in NAD HENT: Head: NCAT.  Right Ear: External ear normal.  Left Ear: External ear normal.  Eyes: . Pupils are equal, round, and reactive to light. Conjunctivae and EOM are normal Nose: without d/c or deformity Neck: Neck supple. Gross normal ROM Cardiovascular:  Normal rate and regular rhythm.   Pulmonary/Chest: Effort normal and breath sounds without rales or wheezing.  Abd:  Soft, NT, ND, + BS, no organomegaly Neurological: Pt is alert. At baseline orientation, motor grossly intact Skin: Skin is warm. No rashes, other new lesions, no LE edema Psychiatric: Pt behavior is normal without agitation  All otherwise neg per pt  POCT glycosylated hemoglobin (Hb A1C) Order: [id number] Status:  Final result Visible to patient:  No (scheduled for 01/16/2020 5:43 PM) Dx:  Impaired glucose tolerance  0 Result Notes  Ref Range & Units 16:42 3 mo ago 4 yr ago 5 yr  ago 6 yr ago  Hemoglobin A1C 4.0 - 5.6 % 5.5  5.2 R, CM  5.5 R, CM  5.3 R, CM  5.6 R, CM        Lab Results  Component Value Date   WBC 6.6 10/16/2019   HGB 14.4 10/16/2019   HCT 42.7 10/16/2019   PLT 176.0 10/16/2019   GLUCOSE 87 10/16/2019   CHOL 227 (H) 10/16/2019   TRIG 121.0 10/16/2019   HDL 56.10 10/16/2019   LDLDIRECT 144.5 11/28/2007   LDLCALC 147 (H) 10/16/2019   ALT 20 10/16/2019   AST 21 10/16/2019   NA 142 10/16/2019   K 3.5 10/16/2019   CL 106 10/16/2019   CREATININE 1.34 10/16/2019   BUN 25 (H) 10/16/2019   CO2 25 10/16/2019   TSH 0.91 10/16/2019   PSA 1.85 10/16/2019   HGBA1C 5.5 01/16/2020       Assessment & Plan:

## 2020-01-16 NOTE — Patient Instructions (Addendum)
Your A1c was OK today  Ok to increase the amlodipine to 10 mg per day  Please take all new medication as prescribed - the HCT fluid pill at 25 mg per day (in the AM)  Please consider checking your BP at home on a regular basis, with the goal being to be less than than 140/90 (or 130/80 would be even better)  Please continue all other medications as before, and refills have been done if requested.  Please have the pharmacy call with any other refills you may need.  Please continue your efforts at being more active, low cholesterol diet, and weight control.  Please keep your appointments with your specialists as you may have planned  Please make an Appointment to return in 4 months (Jan 2022), or sooner if needed

## 2020-01-17 ENCOUNTER — Encounter: Payer: Self-pay | Admitting: Internal Medicine

## 2020-01-17 NOTE — Assessment & Plan Note (Signed)
stable overall by history and exam, recent data reviewed with pt, and pt to continue medical treatment as before,  to f/u any worsening symptoms or concerns  

## 2020-01-17 NOTE — Assessment & Plan Note (Signed)
Uncontrolled, cont ace, increase amldipine 10 qd, add hct 25 qd

## 2020-01-17 NOTE — Assessment & Plan Note (Signed)
stable overall by history and exam, recent data reviewed with pt, and pt to continue medical treatment as before,  to f/u any worsening symptoms or concerns  I spent 31 minutes in preparing to see the patient by review of recent labs, imaging and procedures, obtaining and reviewing separately obtained history, communicating with the patient and family or caregiver, ordering medications, tests or procedures, and documenting clinical information in the EHR including the differential Dx, treatment, and any further evaluation and other management of hyperglycemia, htn, hld 

## 2020-10-12 ENCOUNTER — Other Ambulatory Visit: Payer: Self-pay | Admitting: Internal Medicine

## 2020-10-12 NOTE — Telephone Encounter (Signed)
Please refill as per office routine med refill policy (all routine meds refilled for 3 mo or monthly per pt preference up to one year from last visit, then month to month grace period for 3 mo, then further med refills will have to be denied)  

## 2020-10-18 ENCOUNTER — Encounter: Payer: Self-pay | Admitting: Internal Medicine

## 2020-10-18 ENCOUNTER — Other Ambulatory Visit: Payer: Self-pay

## 2020-10-18 ENCOUNTER — Ambulatory Visit (INDEPENDENT_AMBULATORY_CARE_PROVIDER_SITE_OTHER): Payer: BC Managed Care – PPO | Admitting: Internal Medicine

## 2020-10-18 ENCOUNTER — Other Ambulatory Visit (INDEPENDENT_AMBULATORY_CARE_PROVIDER_SITE_OTHER): Payer: BC Managed Care – PPO

## 2020-10-18 VITALS — BP 138/82 | HR 91 | Temp 98.6°F | Ht 68.0 in | Wt 239.0 lb

## 2020-10-18 DIAGNOSIS — E559 Vitamin D deficiency, unspecified: Secondary | ICD-10-CM

## 2020-10-18 DIAGNOSIS — M25552 Pain in left hip: Secondary | ICD-10-CM | POA: Insufficient documentation

## 2020-10-18 DIAGNOSIS — I10 Essential (primary) hypertension: Secondary | ICD-10-CM

## 2020-10-18 DIAGNOSIS — R7302 Impaired glucose tolerance (oral): Secondary | ICD-10-CM

## 2020-10-18 DIAGNOSIS — K5120 Ulcerative (chronic) proctitis without complications: Secondary | ICD-10-CM | POA: Insufficient documentation

## 2020-10-18 DIAGNOSIS — E538 Deficiency of other specified B group vitamins: Secondary | ICD-10-CM | POA: Diagnosis not present

## 2020-10-18 DIAGNOSIS — Z0001 Encounter for general adult medical examination with abnormal findings: Secondary | ICD-10-CM

## 2020-10-18 DIAGNOSIS — Z23 Encounter for immunization: Secondary | ICD-10-CM

## 2020-10-18 DIAGNOSIS — K641 Second degree hemorrhoids: Secondary | ICD-10-CM | POA: Insufficient documentation

## 2020-10-18 DIAGNOSIS — E7800 Pure hypercholesterolemia, unspecified: Secondary | ICD-10-CM

## 2020-10-18 DIAGNOSIS — K644 Residual hemorrhoidal skin tags: Secondary | ICD-10-CM | POA: Insufficient documentation

## 2020-10-18 DIAGNOSIS — K625 Hemorrhage of anus and rectum: Secondary | ICD-10-CM | POA: Insufficient documentation

## 2020-10-18 MED ORDER — TADALAFIL 20 MG PO TABS: ORAL_TABLET | ORAL | 11 refills | Status: DC

## 2020-10-18 MED ORDER — TRAMADOL HCL 50 MG PO TABS: 50.0000 mg | ORAL_TABLET | Freq: Four times a day (QID) | ORAL | 0 refills | Status: DC | PRN

## 2020-10-18 MED ORDER — LISINOPRIL 40 MG PO TABS: ORAL_TABLET | ORAL | 3 refills | Status: DC

## 2020-10-18 MED ORDER — ATORVASTATIN CALCIUM 40 MG PO TABS: 1.0000 | ORAL_TABLET | Freq: Every day | ORAL | 3 refills | Status: DC

## 2020-10-18 MED ORDER — THERA-D 2000 50 MCG (2000 UT) PO TABS: ORAL_TABLET | ORAL | 99 refills | Status: AC

## 2020-10-18 MED ORDER — HYDROCHLOROTHIAZIDE 25 MG PO TABS: 25.0000 mg | ORAL_TABLET | Freq: Every day | ORAL | 3 refills | Status: DC

## 2020-10-18 NOTE — Assessment & Plan Note (Signed)
Last vitamin D Lab Results  Component Value Date   VD25OH 14.97 (L) 10/16/2019   Low, to start oral replacement

## 2020-10-18 NOTE — Patient Instructions (Signed)
You had the Tdap tetanus shot today  Please take all new medication as prescribed - the tramadol for pain as needed  Please take OTC Vitamin D3 at 2000 units per day, indefinitely  Please continue all other medications as before, and refills have been done if requested.  Please have the pharmacy call with any other refills you may need.  Please continue your efforts at being more active, low cholesterol diet, and weight control.  You are otherwise up to date with prevention measures today.  Please keep your appointments with your specialists as you may have planned  You will be contacted regarding the referral for: Orthopedic at [address] for the left hip  Please go to the LAB at the blood drawing area for the tests to be done - at the [facility] lab site  You will be contacted by phone if any changes need to be made immediately.  Otherwise, you will receive a letter about your results with an explanation, but please check with MyChart first.  Please remember to sign up for MyChart if you have not done so, as this will be important to you in the future with finding out test results, communicating by private email, and scheduling acute appointments online when needed.  Please make an Appointment to return in 6 months, or sooner if needed

## 2020-10-18 NOTE — Progress Notes (Signed)
Patient ID: Chad Murphy, male   DOB: 1960/11/15, 60 y.o.   MRN: [id number]         Chief Complaint:: wellness exam and Annual Exam (Left side (Hip)pain feels sharp not constant.)  , hyperglycemia, low vit d , htn, hld       HPI:  Chad Murphy is a 60 y.o. 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 No falls or giveaways. Asks for pain control and ortho referral.   Not taking Vit D but willing to start.  Pt denies chest pain, increased sob or doe, wheezing, orthopnea, PND, increased LE swelling, palpitations, dizziness or syncope.   Pt denies polydipsia, polyuria, or new focal neuro s/s   Pt denies fever, wt loss, night sweats, loss of appetite, or other constitutional symptoms   No new complaints  Wt Readings from Last 3 Encounters:  10/18/20 239 lb (108.4 kg)  01/16/20 222 lb (100.7 kg)  10/16/19 224 lb (101.6 kg)   BP Readings from Last 3 Encounters:  10/18/20 138/82  01/16/20 (!) 170/100  10/16/19 (!) 182/106   Immunization History  Administered Date(s) Administered   Influenza Split 04/18/2011, 03/13/2012   Influenza Whole 02/09/2010   Influenza,inj,Quad PF,6+ Mos 02/12/2014, 02/29/2016   Influenza-Unspecified 03/07/2020   PFIZER(Purple Top)SARS-COV-2 Vaccination 08/21/2019, 09/15/2019,  04/05/2020   Td 05/08/2006   Tdap 10/18/2020   There are no preventive care reminders to display for this patient.     Past Medical History:  Diagnosis Date   History of low back pain    Hyperlipidemia    Hypertension, essential    Scrotal lesion    Past Surgical History:  Procedure Laterality Date   EXCISION EPIDERMOID CYST, ABDOMINAL WALL  05-19-2003   KNEE ARTHROSCOPY Left 2000   SCROTAL EXPLORATION N/A 12/17/2012   Procedure: EXCISION OF SCROTAL LESION;  Surgeon: Martina Sinner, MD;  Location: [facility];  Service: Urology;  Laterality: N/A;   WRIST GANGLION EXCISION Left 2002    reports that he quit smoking about 27 years ago. His smoking use included cigarettes. He has a 3.00 pack-year smoking history. He has never used smokeless tobacco. He reports current alcohol use. He reports that he does not use drugs. family history includes Allergies in an other family member; Arthritis in an other family member; Cancer in his sister; Diabetes in an other family member; Hypertension in an other family member. No Known Allergies Current Outpatient Medications on File Prior to Visit  Medication Sig Dispense Refill   amLODipine (NORVASC) 10 MG tablet Take 1 tablet (10 mg total) by mouth daily. 90 tablet 3   aspirin EC 81 MG tablet Take 1 tablet (  81 mg total) by mouth daily. 90 tablet 11   Multiple Vitamin (MULTIVITAMIN) tablet Take 1 tablet by mouth daily.     naproxen (NAPROSYN) 500 MG tablet TAKE 1 TABLET BY MOUTH TWICE DAILY WITH A MEAL AS NEEDED FOR PAIN 60 tablet 3   triamcinolone (NASACORT AQ) 55 MCG/ACT AERO nasal inhaler Place 2 sprays into the nose daily. 1 Inhaler 12   Vitamin D, Ergocalciferol, (DRISDOL) 1.25 MG (50000 UNIT) CAPS capsule Take 1 capsule (50,000 Units total) by mouth every 7 (seven) days. 12 capsule 0   No current facility-administered medications on file prior to visit.        ROS:  All others reviewed and negative.  Objective         PE:  BP 138/82 (BP Location: Left Arm, Patient Position: Sitting, Cuff Size: Large)   Pulse 91   Temp 98.6 F (37 C) (Oral)   Ht 5\' 8"  (1.727 m)   Wt 239 lb (108.4 kg)   SpO2 94%   BMI 36.34 kg/m                 Constitutional: Pt appears in NAD               HENT: Head: NCAT.                Right Ear: External ear normal.                 Left Ear: External ear normal.                Eyes: . Pupils are equal, round, and reactive to light. Conjunctivae and EOM are normal               Nose: without d/c or deformity               Neck: Neck supple. Gross normal ROM               Cardiovascular: Normal rate and regular rhythm.                 Pulmonary/Chest: Effort normal and breath sounds without rales or wheezing.                Abd:  Soft, NT, ND, + BS, no organomegaly               Left hip with pain on flexion to 80 degrees               Neurological: Pt is alert. At baseline orientation, motor grossly intact               Skin: Skin is warm. No rashes, no other new lesions, LE edema - none               Psychiatric: Pt behavior is normal without agitation   Micro: none  Cardiac tracings I have personally interpreted today:  none  Pertinent Radiological findings (summarize): none   Lab Results  Component Value Date   WBC 6.7 10/19/2020   HGB 14.4 10/19/2020   HCT 42.0 10/19/2020   PLT 194.0 10/19/2020   GLUCOSE 86 10/19/2020   CHOL 140 10/19/2020   TRIG 85.0 10/19/2020   HDL 44.90 10/19/2020   LDLDIRECT 144.5 11/28/2007   LDLCALC 78 10/19/2020   ALT 25 10/19/2020   AST 18 10/19/2020   NA 142 10/19/2020   K 4.2 10/19/2020   CL 107 10/19/2020   CREATININE 1.53 (H)  10/19/2020   BUN 19 10/19/2020   CO2 28 10/19/2020   TSH 0.96 10/19/2020   PSA 2.16 10/19/2020   HGBA1C 6.0 10/19/2020   Assessment/Plan:  Chad Murphy is a 60 y.o. Black or African American [2] male with  has a past medical history of History of low back pain, Hyperlipidemia, Hypertension,  essential, and Scrotal lesion.  Vitamin D deficiency Last vitamin D Lab Results  Component Value Date   VD25OH 14.97 (L) 10/16/2019   Low, to start oral replacement   Encounter for well adult exam with abnormal findings Age and sex appropriate education and counseling updated with regular exercise and diet Referrals for preventative services - none needed Immunizations addressed - for Tdap, declines shingrix and pneumovax, to have covid booster at [facility] himself Smoking counseling  - none needed Evidence for depression or other mood disorder - none significant Most recent labs reviewed. I have personally reviewed and have noted: 1) the patient's medical and social history 2) The patient's current medications and supplements 3) The patient's height, weight, and BMI have been recorded in the chart   Hyperlipidemia Lab Results  Component Value Date   LDLCALC 78 10/19/2020   Stable, pt to continue current statin lipitor 40   Impaired glucose tolerance Lab Results  Component Value Date   HGBA1C 6.0 10/19/2020   Stable, pt to continue current medical treatment  - diet   Hypertension BP Readings from Last 3 Encounters:  10/18/20 138/82  01/16/20 (!) 170/100  10/16/19 (!) 182/106   Stable, pt to continue medical treatment norvasc, hct, lisinopril   Left hip pain 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  Oliver Barre, MD 10/20/2020 9:43 PM [facility]

## 2020-10-19 ENCOUNTER — Encounter: Payer: Self-pay | Admitting: Internal Medicine

## 2020-10-19 LAB — VITAMIN B12: Vitamin B-12: 364 pg/mL (ref 211–911)

## 2020-10-19 LAB — BASIC METABOLIC PANEL
BUN: 19 mg/dL (ref 6–23)
CO2: 28 mEq/L (ref 19–32)
Calcium: 9.3 mg/dL (ref 8.4–10.5)
Chloride: 107 mEq/L (ref 96–112)
Creatinine, Ser: 1.53 mg/dL — ABNORMAL HIGH (ref 0.40–1.50)
GFR: 49.31 mL/min — ABNORMAL LOW (ref >60.00)
Glucose, Bld: 86 mg/dL (ref 70–99)
Potassium: 4.2 mEq/L (ref 3.5–5.1)
Sodium: 142 mEq/L (ref 135–145)

## 2020-10-19 LAB — HEPATIC FUNCTION PANEL
ALT: 25 U/L (ref 0–53)
AST: 18 U/L (ref 0–37)
Albumin: 4.4 g/dL (ref 3.5–5.2)
Alkaline Phosphatase: 99 U/L (ref 39–117)
Bilirubin, Direct: 0.1 mg/dL (ref 0.0–0.3)
Total Bilirubin: 0.4 mg/dL (ref 0.2–1.2)
Total Protein: 7.3 g/dL (ref 6.0–8.3)

## 2020-10-19 LAB — CBC WITH DIFFERENTIAL/PLATELET
Basophils Absolute: 0.1 10*3/uL (ref 0.0–0.1)
Basophils Relative: 1.3 % (ref 0.0–3.0)
Eosinophils Absolute: 0.3 10*3/uL (ref 0.0–0.7)
Eosinophils Relative: 4.1 % (ref 0.0–5.0)
HCT: 42 % (ref 39.0–52.0)
Hemoglobin: 14.4 g/dL (ref 13.0–17.0)
Lymphocytes Relative: 42.2 % (ref 12.0–46.0)
Lymphs Abs: 2.8 10*3/uL (ref 0.7–4.0)
MCHC: 34.3 g/dL (ref 30.0–36.0)
MCV: 88.9 fl (ref 78.0–100.0)
Monocytes Absolute: 0.6 10*3/uL (ref 0.1–1.0)
Monocytes Relative: 9.2 % (ref 3.0–12.0)
Neutro Abs: 2.9 10*3/uL (ref 1.4–7.7)
Neutrophils Relative %: 43.2 % (ref 43.0–77.0)
Platelets: 194 10*3/uL (ref 150.0–400.0)
RBC: 4.73 Mil/uL (ref 4.22–5.81)
RDW: 14.9 % (ref 11.5–15.5)
WBC: 6.7 10*3/uL (ref 4.0–10.5)

## 2020-10-19 LAB — TSH: TSH: 0.96 u[IU]/mL (ref 0.35–4.50)

## 2020-10-19 LAB — PSA: PSA: 2.16 ng/mL (ref 0.10–4.00)

## 2020-10-19 LAB — URINALYSIS, ROUTINE W REFLEX MICROSCOPIC
Bilirubin Urine: NEGATIVE
Hgb urine dipstick: NEGATIVE
Ketones, ur: NEGATIVE
Leukocytes,Ua: NEGATIVE
Nitrite: NEGATIVE
RBC / HPF: NONE SEEN (ref 0–?)
Specific Gravity, Urine: 1.02 (ref 1.000–1.030)
Total Protein, Urine: NEGATIVE
Urine Glucose: NEGATIVE
Urobilinogen, UA: 0.2 (ref 0.0–1.0)
WBC, UA: NONE SEEN (ref 0–?)
pH: 5.5 (ref 5.0–8.0)

## 2020-10-19 LAB — LIPID PANEL
Cholesterol: 140 mg/dL (ref 0–200)
HDL: 44.9 mg/dL (ref >39.00)
LDL Cholesterol: 78 mg/dL (ref 0–99)
NonHDL: 95.27
Total CHOL/HDL Ratio: 3
Triglycerides: 85 mg/dL (ref 0.0–149.0)
VLDL: 17 mg/dL (ref 0.0–40.0)

## 2020-10-19 LAB — HEMOGLOBIN A1C: Hgb A1c MFr Bld: 6 % (ref 4.6–6.5)

## 2020-10-19 LAB — VITAMIN D 25 HYDROXY (VIT D DEFICIENCY, FRACTURES): VITD: 30.58 ng/mL (ref 30.00–100.00)

## 2020-10-20 NOTE — Assessment & Plan Note (Signed)
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

## 2020-10-20 NOTE — Assessment & Plan Note (Signed)
Lab Results  Component Value Date   HGBA1C 6.0 10/19/2020   Stable, pt to continue current medical treatment  - diet

## 2020-10-20 NOTE — Assessment & Plan Note (Signed)
Lab Results  Component Value Date   LDLCALC 78 10/19/2020   Stable, pt to continue current statin lipitor 40

## 2020-10-20 NOTE — Assessment & Plan Note (Signed)
BP Readings from Last 3 Encounters:  10/18/20 138/82  01/16/20 (!) 170/100  10/16/19 (!) 182/106   Stable, pt to continue medical treatment norvasc, hct, lisinopril

## 2020-10-20 NOTE — Assessment & Plan Note (Signed)
Age and sex appropriate education and counseling updated with regular exercise and diet Referrals for preventative services - none needed Immunizations addressed - for Tdap, declines shingrix and pneumovax, to have covid booster at [employer] himself Smoking counseling  - none needed Evidence for depression or other mood disorder - none significant Most recent labs reviewed. I have personally reviewed and have noted: 1) the patient's medical and social history 2) The patient's current medications and supplements 3) The patient's height, weight, and BMI have been recorded in the chart

## 2020-10-28 ENCOUNTER — Other Ambulatory Visit: Payer: Self-pay | Admitting: Internal Medicine

## 2020-10-28 DIAGNOSIS — E7800 Pure hypercholesterolemia, unspecified: Secondary | ICD-10-CM

## 2020-10-28 DIAGNOSIS — E538 Deficiency of other specified B group vitamins: Secondary | ICD-10-CM

## 2020-10-28 DIAGNOSIS — Z0001 Encounter for general adult medical examination with abnormal findings: Secondary | ICD-10-CM

## 2020-10-28 DIAGNOSIS — E559 Vitamin D deficiency, unspecified: Secondary | ICD-10-CM

## 2020-10-28 DIAGNOSIS — R7302 Impaired glucose tolerance (oral): Secondary | ICD-10-CM

## 2021-01-09 ENCOUNTER — Other Ambulatory Visit: Payer: Self-pay | Admitting: Internal Medicine

## 2021-01-09 NOTE — Telephone Encounter (Signed)
Please refill as per office routine med refill policy (all routine meds to be refilled for 3 mo or monthly (per pt preference) up to one year from last visit, then month to month grace period for 3 mo, then further med refills will have to be denied) ? ?

## 2021-04-05 ENCOUNTER — Encounter: Payer: Self-pay | Admitting: Internal Medicine

## 2021-04-05 ENCOUNTER — Other Ambulatory Visit: Payer: Self-pay

## 2021-04-05 ENCOUNTER — Ambulatory Visit: Payer: BC Managed Care – PPO | Admitting: Internal Medicine

## 2021-04-05 VITALS — BP 120/78 | HR 85 | Temp 98.9°F | Ht 68.0 in | Wt 232.0 lb

## 2021-04-05 DIAGNOSIS — S46211A Strain of muscle, fascia and tendon of other parts of biceps, right arm, initial encounter: Secondary | ICD-10-CM

## 2021-04-05 DIAGNOSIS — E559 Vitamin D deficiency, unspecified: Secondary | ICD-10-CM

## 2021-04-05 DIAGNOSIS — Z01818 Encounter for other preprocedural examination: Secondary | ICD-10-CM

## 2021-04-05 DIAGNOSIS — R7302 Impaired glucose tolerance (oral): Secondary | ICD-10-CM

## 2021-04-05 DIAGNOSIS — I10 Essential (primary) hypertension: Secondary | ICD-10-CM

## 2021-04-05 LAB — POCT GLYCOSYLATED HEMOGLOBIN (HGB A1C): Hemoglobin A1C: 5.4 % (ref 4.0–5.6)

## 2021-04-05 NOTE — Assessment & Plan Note (Signed)
Stable overall, ok for surgury as planned

## 2021-04-05 NOTE — Assessment & Plan Note (Signed)
New incidental, for referral to local ortho, but with one tendon at the elbow intact may not need surgury, but should avoid all heavy lifting

## 2021-04-05 NOTE — Patient Instructions (Signed)
Your A1c was done today  We can sign the form for preoperative clearance when we get the form for the left hip  Please continue all other medications as before, and refills have been done if requested.  Please have the pharmacy call with any other refills you may need.  Please continue your efforts at being more active, low cholesterol diet, and weight control.  Please keep your appointments with your specialists as you may have planned  You will be contacted regarding the referral for: local orthopedic for the partial right biceip tendon rupture - please do no heavy lifting or other use of the arms until then  Please make an Appointment to return in 6 months, or sooner if needed

## 2021-04-05 NOTE — Assessment & Plan Note (Signed)
BP Readings from Last 3 Encounters:  04/05/21 120/78  10/18/20 138/82  01/16/20 (!) 170/100   Stable, pt to continue medical treatment amlodipine, hct, lisinopril

## 2021-04-05 NOTE — Assessment & Plan Note (Signed)
Last vitamin D Lab Results  Component Value Date   VD25OH 30.58 10/19/2020   Low, to start oral replacement

## 2021-04-05 NOTE — Assessment & Plan Note (Signed)
Lab Results  Component Value Date   HGBA1C 5.4 04/05/2021   Stable, pt to continue current medical treatment  - diet

## 2021-04-05 NOTE — Progress Notes (Signed)
Patient ID: Chad Murphy, male   DOB: [birth year], 60 y.o.   MRN: [id number]        Chief Complaint: follow up preop evaluation, hyperglycemia, right bicep pain       HPI:  Chad Murphy is a 60 y.o. male here overall doing ok but will need left hip THA soon with a ortho group in [place] due to 2-3 yrs now uncontrolled pain, with f/u appt soon dec 7 then surgury to be scheduled after that.  Pt denies chest pain, increased sob or doe, wheezing, orthopnea, PND, increased LE swelling, palpitations, dizziness or syncope.   Pt denies polydipsia, polyuria, or new focal neuro s/s.  Also incidentally today with large swelling, bruising to the right bicep for one week after doing some curls at home with wt lifting as he sometimes does.  Has been some since then but just doesn't feel the same.         Wt Readings from Last 3 Encounters:  04/05/21 232 lb (105.2 kg)  10/18/20 239 lb (108.4 kg)  01/16/20 222 lb (100.7 kg)   BP Readings from Last 3 Encounters:  04/05/21 120/78  10/18/20 138/82  01/16/20 (!) 170/100         Past Medical History:  Diagnosis Date   History of low back pain    Hyperlipidemia    Hypertension, essential    Scrotal lesion    Past Surgical History:  Procedure Laterality Date   EXCISION EPIDERMOID CYST, ABDOMINAL WALL  05-19-2003   KNEE ARTHROSCOPY Left 2000   SCROTAL EXPLORATION N/A 12/17/2012   Procedure: EXCISION OF SCROTAL LESION;  Surgeon: Martina Sinner, MD;  Location: [facility];  Service: Urology;  Laterality: N/A;   WRIST GANGLION EXCISION Left 2002    reports that he quit smoking about 28 years ago. His smoking use included cigarettes. He has a 3.00 pack-year smoking history. He has never used smokeless tobacco. He reports current alcohol use. He reports that he does not use drugs. family history includes Allergies in an other family member; Arthritis in an other family member; Cancer in his sister; Diabetes in an other family  member; Hypertension in an other family member. No Known Allergies Current Outpatient Medications on File Prior to Visit  Medication Sig Dispense Refill   amLODipine (NORVASC) 10 MG tablet TAKE 1 TABLET BY MOUTH EVERY DAY 90 tablet 3   aspirin EC 81 MG tablet Take 1 tablet (81 mg total) by mouth daily. 90 tablet 11   atorvastatin (LIPITOR) 40 MG tablet Take 1 tablet (40 mg total) by mouth daily. 90 tablet 3   Cholecalciferol (THERA-D 2000) 50 MCG (2000 UT) TABS 1 tab by mouth once daily 30 tablet 99   hydrochlorothiazide (HYDRODIURIL) 25 MG tablet Take 1 tablet (25 mg total) by mouth daily. 90 tablet 3   lisinopril (ZESTRIL) 40 MG tablet TAKE 1 TABLET BY MOUTH EVERY DAY IN THE EVENING 90 tablet 3   Multiple Vitamin (MULTIVITAMIN) tablet Take 1 tablet by mouth daily.     naproxen (NAPROSYN) 500 MG tablet TAKE 1 TABLET BY MOUTH TWICE DAILY WITH A MEAL AS NEEDED FOR PAIN 60 tablet 3   tadalafil (CIALIS) 20 MG tablet TAKE 1 TABLET BY MOUTH DAILY AS NEEDED FOR ERECTILE DYSFUNCTION 10 tablet 11   traMADol (ULTRAM) 50 MG tablet Take 1 tablet (50 mg total) by mouth every 6 (six) hours as needed. 30 tablet 0   triamcinolone (NASACORT AQ) 55 MCG/ACT AERO nasal  inhaler Place 2 sprays into the nose daily. 1 Inhaler 12   Vitamin D, Ergocalciferol, (DRISDOL) 1.25 MG (50000 UNIT) CAPS capsule Take 1 capsule (50,000 Units total) by mouth every 7 (seven) days. 12 capsule 0   No current facility-administered medications on file prior to visit.        ROS:  All others reviewed and negative.  Objective        PE:  BP 120/78 (BP Location: Left Arm, Patient Position: Sitting, Cuff Size: Large)   Pulse 85   Temp 98.9 F (37.2 C) (Oral)   Ht 5\' 8"  (1.727 m)   Wt 232 lb (105.2 kg)   SpO2 97%   BMI 35.28 kg/m                 Constitutional: Pt appears in NAD               HENT: Head: NCAT.                Right Ear: External ear normal.                 Left Ear: External ear normal.                Eyes: .  Pupils are equal, round, and reactive to light. Conjunctivae and EOM are normal               Nose: without d/c or deformity               Neck: Neck supple. Gross normal ROM               Cardiovascular: Normal rate and regular rhythm.                 Pulmonary/Chest: Effort normal and breath sounds without rales or wheezing.                Abd:  Soft, NT, ND, + BS, no organomegaly               Neurological: Pt is alert. At baseline orientation, motor grossly intact; right bicep aare with 1-21= bruising, tender swelling and unable to palpate distal lateal bicep tendon and bicep itself if somewhat bunched up               Skin: Skin is warm. No rashes, no other new lesions, LE edema - none               Psychiatric: Pt behavior is normal without agitation   Micro: none  Cardiac tracings I have personally interpreted today:  none  Pertinent Radiological findings (summarize): none   Lab Results  Component Value Date   WBC 6.7 10/19/2020   HGB 14.4 10/19/2020   HCT 42.0 10/19/2020   PLT 194.0 10/19/2020   GLUCOSE 86 10/19/2020   CHOL 140 10/19/2020   TRIG 85.0 10/19/2020   HDL 44.90 10/19/2020   LDLDIRECT 144.5 11/28/2007   LDLCALC 78 10/19/2020   ALT 25 10/19/2020   AST 18 10/19/2020   NA 142 10/19/2020   K 4.2 10/19/2020   CL 107 10/19/2020   CREATININE 1.53 (H) 10/19/2020   BUN 19 10/19/2020   CO2 28 10/19/2020   TSH 0.96 10/19/2020   PSA 2.16 10/19/2020   HGBA1C 5.4 04/05/2021   Hemoglobin A1C 4.0 - 5.6 % 5.4  6.0 R, CM  5.5  5.2 R, CM  5.5 R, CM  5.3 R, CM  5.6 R  Assessment/Plan:  Chad Murphy is a 60 y.o. Black or African American [2] male with  has a past medical history of History of low back pain, Hyperlipidemia, Hypertension, essential, and Scrotal lesion.  Vitamin D deficiency Last vitamin D Lab Results  Component Value Date   VD25OH 30.58 10/19/2020   Low, to start oral replacement   Impaired glucose tolerance Lab Results  Component Value  Date   HGBA1C 5.4 04/05/2021   Stable, pt to continue current medical treatment  - diet   Hypertension BP Readings from Last 3 Encounters:  04/05/21 120/78  10/18/20 138/82  01/16/20 (!) 170/100   Stable, pt to continue medical treatment amlodipine, hct, lisinopril   Preop examination Stable overall, ok for surgury as planned  Rupture of right biceps tendon New incidental, for referral to local ortho, but with one tendon at the elbow intact may not need surgury, but should avoid all heavy lifting Followup: Return in about 6 months (around 10/03/2021).  Oliver Barre, MD 04/05/2021 7:22 PM [facility]

## 2021-04-19 ENCOUNTER — Other Ambulatory Visit: Payer: Self-pay

## 2021-04-19 ENCOUNTER — Ambulatory Visit (INDEPENDENT_AMBULATORY_CARE_PROVIDER_SITE_OTHER): Payer: BC Managed Care – PPO | Admitting: Orthopaedic Surgery

## 2021-04-19 ENCOUNTER — Ambulatory Visit: Payer: Self-pay

## 2021-04-19 DIAGNOSIS — M25511 Pain in right shoulder: Secondary | ICD-10-CM

## 2021-04-19 DIAGNOSIS — G8929 Other chronic pain: Secondary | ICD-10-CM

## 2021-04-19 NOTE — Progress Notes (Signed)
Office Visit Note   Patient: Chad Murphy           Date of Birth: Feb 16, 1961           MRN: [id number] Visit Date: 04/19/2021              Requested by: Corwin Levins, MD [address] [place],  [place] [address] PCP: Corwin Levins, MD   Assessment & Plan: Visit Diagnoses:  1. Chronic right shoulder pain     Plan: Impression is right shoulder proximal biceps tendon tear.  He does admit that he is getting some discomfort with certain motions of the shoulder in addition to the fatigue at the end of the day.  We have discussed getting an MRI of the right shoulder to assess for biceps tendon tear and retraction.  He would like to proceed.  He will follow-up with [place] once this is been completed.  Follow-Up Instructions: Return for after MRI.   Orders:  Orders Placed This Encounter  Procedures   XR Shoulder Right    No orders of the defined types were placed in this encounter.     Procedures: No procedures performed   Clinical Data: No additional findings.   Subjective: Chief Complaint  Patient presents with   Right Arm - Follow-up    HPI patient is a pleasant 60 year old gentleman who comes in today with concerns about his right shoulder.  Approximately 1 month ago, he noticed slight discomfort and bruising around the proximal biceps 3 days after doing bicep curls.  He denies any actual pain but does note fatigue and weakness towards the end of the working day.  He has been taking naproxen which does seem to help.  Review of Systems as detailed in HPI.  All others reviewed and are negative.   Objective: Vital Signs: There were no vitals taken for this visit.  Physical Exam well-developed well-nourished gentleman in no acute distress.  Alert and oriented x3.  Ortho Exam right shoulder exam shows a Popeye deformity.  No ecchymosis.  Negative speeds test.  Full strength with biceps testing.  He has mild tenderness to the bicipital groove.  Specialty  Comments:  No specialty comments available.  Imaging: XR Shoulder Right  Result Date: 04/19/2021 Moderate degenerative changes to the A/C joint and glenohumeral joint    PMFS History: Patient Active Problem List   Diagnosis Date Noted   Preop examination 04/05/2021   Rupture of right biceps tendon 04/05/2021   Chronic ulcerative proctitis (HCC) 10/18/2020   Hemorrhage of rectum and anus 10/18/2020   Residual hemorrhoidal skin tags 10/18/2020   Second degree hemorrhoids 10/18/2020   Encounter for well adult exam with abnormal findings 10/18/2020   Left hip pain 10/18/2020   Vitamin D deficiency 10/18/2020   Sciatica 10/19/2019   Hypertension 10/16/2019   Sinusitis 05/21/2019   Conjunctivitis 05/21/2019   Cough 03/06/2016   Acute upper respiratory infection 01/21/2015   Finger pain, left 11/25/2014   Primary localized osteoarthrosis, lower leg 07/25/2013   Impaired glucose tolerance 07/18/2013   Left knee pain 07/18/2013   Scrotal mass 12/10/2012   Thyroid enlarged 06/21/2012   Right ankle pain 04/23/2011   Screening for malignant neoplasm of colon 08/05/2010   KNEE PAIN, RIGHT, ACUTE 04/16/2009   Abdominal pain, generalized 06/10/2008   Hyperlipidemia 12/27/2006   Obesity 12/27/2006   ERECTILE DYSFUNCTION 12/27/2006   Allergic rhinitis 12/27/2006   LOW BACK PAIN 12/27/2006   GANGLION CYST, HX OF 12/27/2006  Past Medical History:  Diagnosis Date   History of low back pain    Hyperlipidemia    Hypertension, essential    Scrotal lesion     Family History  Problem Relation Age of Onset   Cancer Sister        Breast Cancer and with non-colon cancer   Diabetes Other    Hypertension Other    Arthritis Other    Allergies Other     Past Surgical History:  Procedure Laterality Date   EXCISION EPIDERMOID CYST, ABDOMINAL WALL  05-19-2003   KNEE ARTHROSCOPY Left 2000   SCROTAL EXPLORATION N/A 12/17/2012   Procedure: EXCISION OF SCROTAL LESION;  Surgeon: Martina Sinner, MD;  Location: [facility];  Service: Urology;  Laterality: N/A;   WRIST GANGLION EXCISION Left 2002   Social History   Occupational History   Occupation: makes medical kits with [employer]  Tobacco Use   Smoking status: Former    Packs/day: 0.50    Years: 6.00    Pack years: 3.00    Types: Cigarettes    Quit date: 12/13/1992    Years since quitting: 28.3   Smokeless tobacco: Never  Substance and Sexual Activity   Alcohol use: Yes    Comment: 0CCASIONAL   Drug use: No   Sexual activity: Not on file

## 2021-04-20 ENCOUNTER — Other Ambulatory Visit: Payer: Self-pay

## 2021-04-20 DIAGNOSIS — M25511 Pain in right shoulder: Secondary | ICD-10-CM

## 2021-04-20 DIAGNOSIS — G8929 Other chronic pain: Secondary | ICD-10-CM

## 2021-05-05 ENCOUNTER — Telehealth: Payer: Self-pay | Admitting: Orthopaedic Surgery

## 2021-05-05 NOTE — Telephone Encounter (Signed)
Pt calling stating he has sch'd his MRI but the facility could not tell him how much it would cost. Pt needed to know the cost so he can understand if he can afford the MRI or not. Pt sch'd a follow up appt with Roda Shutters but is unsure who to get in contact with if the facility can not tell him what the cost would be. The best call back number is [phone]

## 2021-05-06 NOTE — Telephone Encounter (Signed)
Left message to pt that I do not know anything about the cost of the mri etc and to contact the imaging again and ask for billing and insurance someone from that dept should be able to help him.

## 2021-05-17 ENCOUNTER — Ambulatory Visit
Admission: RE | Admit: 2021-05-17 | Discharge: 2021-05-17 | Disposition: A | Discharge status: Home / Self Care | Payer: BC Managed Care – PPO | Source: Ambulatory Visit | Attending: Orthopaedic Surgery | Admitting: Orthopaedic Surgery

## 2021-05-17 ENCOUNTER — Other Ambulatory Visit: Payer: Self-pay

## 2021-05-17 DIAGNOSIS — M25511 Pain in right shoulder: Secondary | ICD-10-CM

## 2021-05-17 DIAGNOSIS — G8929 Other chronic pain: Secondary | ICD-10-CM

## 2021-05-19 ENCOUNTER — Encounter: Payer: Self-pay | Admitting: Orthopaedic Surgery

## 2021-05-19 ENCOUNTER — Ambulatory Visit: Payer: BC Managed Care – PPO | Admitting: Orthopaedic Surgery

## 2021-05-19 ENCOUNTER — Other Ambulatory Visit: Payer: Self-pay

## 2021-05-19 DIAGNOSIS — M25511 Pain in right shoulder: Secondary | ICD-10-CM

## 2021-05-19 DIAGNOSIS — S46211A Strain of muscle, fascia and tendon of other parts of biceps, right arm, initial encounter: Secondary | ICD-10-CM

## 2021-05-19 DIAGNOSIS — G8929 Other chronic pain: Secondary | ICD-10-CM | POA: Diagnosis not present

## 2021-05-19 NOTE — Progress Notes (Signed)
Office Visit Note   Patient: Chad Murphy           Date of Birth: [birth year]           MRN: [id number] Visit Date: 05/19/2021              Requested by: Corwin Levins, MD [address] [place],  [place] [address] PCP: Corwin Levins, MD   Assessment & Plan: Visit Diagnoses:  1. Rupture of right biceps tendon, initial encounter   2. Chronic right shoulder pain     Plan: Chad Murphy returns today to discuss right shoulder MRI results.  In terms of his symptoms he is doing quite well and reports no limitations with any daily activities.  He feels some fatigue with increased activity.  Denies any pain.  Right shoulder exam is fairly benign.  He has good range of motion and strength without significant pain or discomfort.  The MRI shows diffuse degenerative changes of the rotator cuff without any full-thickness tears.  Long head of the biceps is torn with some loose bodies within the bicipital tendon sheath.  He has severe glenohumeral osteoarthritis.  These findings were reviewed with the patient.  Clinically speaking he is doing great and does not have any real complaints.  He is able to live with this.  From my standpoint no intervention is indicated at this time.  We will see him back as needed.  Follow-Up Instructions: No follow-ups on file.   Orders:  No orders of the defined types were placed in this encounter.  No orders of the defined types were placed in this encounter.     Procedures: No procedures performed   Clinical Data: No additional findings.   Subjective: Chief Complaint  Patient presents with   Right Shoulder - Pain    HPI  Review of Systems   Objective: Vital Signs: There were no vitals taken for this visit.  Physical Exam  Ortho Exam  Specialty Comments:  No specialty comments available.  Imaging: No results found.   PMFS History: Patient Active Problem List   Diagnosis Date Noted   Preop examination 04/05/2021    Rupture of right biceps tendon 04/05/2021   Chronic ulcerative proctitis (HCC) 10/18/2020   Hemorrhage of rectum and anus 10/18/2020   Residual hemorrhoidal skin tags 10/18/2020   Second degree hemorrhoids 10/18/2020   Encounter for well adult exam with abnormal findings 10/18/2020   Left hip pain 10/18/2020   Vitamin D deficiency 10/18/2020   Sciatica 10/19/2019   Hypertension 10/16/2019   Sinusitis 05/21/2019   Conjunctivitis 05/21/2019   Cough 03/06/2016   Acute upper respiratory infection 01/21/2015   Finger pain, left 11/25/2014   Primary localized osteoarthrosis, lower leg 07/25/2013   Impaired glucose tolerance 07/18/2013   Left knee pain 07/18/2013   Scrotal mass 12/10/2012   Thyroid enlarged 06/21/2012   Right ankle pain 04/23/2011   Screening for malignant neoplasm of colon 08/05/2010   KNEE PAIN, RIGHT, ACUTE 04/16/2009   Abdominal pain, generalized 06/10/2008   Hyperlipidemia 12/27/2006   Obesity 12/27/2006   ERECTILE DYSFUNCTION 12/27/2006   Allergic rhinitis 12/27/2006   LOW BACK PAIN 12/27/2006   GANGLION CYST, HX OF 12/27/2006   Past Medical History:  Diagnosis Date   History of low back pain    Hyperlipidemia    Hypertension, essential    Scrotal lesion     Family History  Problem Relation Age of Onset   Cancer Sister  Breast Cancer and with non-colon cancer   Diabetes Other    Hypertension Other    Arthritis Other    Allergies Other     Past Surgical History:  Procedure Laterality Date   EXCISION EPIDERMOID CYST, ABDOMINAL WALL  05-19-2003   KNEE ARTHROSCOPY Left 2000   SCROTAL EXPLORATION N/A 12/17/2012   Procedure: EXCISION OF SCROTAL LESION;  Surgeon: Martina Sinner, MD;  Location: [facility] [place];  Service: Urology;  Laterality: N/A;   WRIST GANGLION EXCISION Left 2002   Social History   Occupational History   Occupation: makes medical kits with [employer]  Tobacco Use   Smoking status: Former    Packs/day:  0.50    Years: 6.00    Pack years: 3.00    Types: Cigarettes    Quit date: 12/13/1992    Years since quitting: 28.4   Smokeless tobacco: Never  Substance and Sexual Activity   Alcohol use: Yes    Comment: 0CCASIONAL   Drug use: No   Sexual activity: Not on file

## 2021-06-30 DIAGNOSIS — M1612 Unilateral primary osteoarthritis, left hip: Secondary | ICD-10-CM | POA: Insufficient documentation

## 2021-10-06 ENCOUNTER — Encounter: Payer: Self-pay | Admitting: Internal Medicine

## 2021-10-06 ENCOUNTER — Other Ambulatory Visit (INDEPENDENT_AMBULATORY_CARE_PROVIDER_SITE_OTHER): Payer: BC Managed Care – PPO

## 2021-10-06 ENCOUNTER — Ambulatory Visit: Payer: BC Managed Care – PPO | Admitting: Internal Medicine

## 2021-10-06 VITALS — BP 126/76 | HR 92 | Temp 98.2°F | Ht 68.0 in | Wt 241.0 lb

## 2021-10-06 DIAGNOSIS — E7800 Pure hypercholesterolemia, unspecified: Secondary | ICD-10-CM

## 2021-10-06 DIAGNOSIS — Z0001 Encounter for general adult medical examination with abnormal findings: Secondary | ICD-10-CM

## 2021-10-06 DIAGNOSIS — N1831 Chronic kidney disease, stage 3a: Secondary | ICD-10-CM | POA: Diagnosis not present

## 2021-10-06 DIAGNOSIS — Z125 Encounter for screening for malignant neoplasm of prostate: Secondary | ICD-10-CM

## 2021-10-06 DIAGNOSIS — R7302 Impaired glucose tolerance (oral): Secondary | ICD-10-CM

## 2021-10-06 DIAGNOSIS — E538 Deficiency of other specified B group vitamins: Secondary | ICD-10-CM | POA: Diagnosis not present

## 2021-10-06 DIAGNOSIS — I10 Essential (primary) hypertension: Secondary | ICD-10-CM

## 2021-10-06 DIAGNOSIS — Z860100 Personal history of colon polyps, unspecified: Secondary | ICD-10-CM

## 2021-10-06 DIAGNOSIS — E559 Vitamin D deficiency, unspecified: Secondary | ICD-10-CM

## 2021-10-06 DIAGNOSIS — Z1211 Encounter for screening for malignant neoplasm of colon: Secondary | ICD-10-CM

## 2021-10-06 LAB — CBC WITH DIFFERENTIAL/PLATELET
Basophils Absolute: 0.1 10*3/uL (ref 0.0–0.1)
Basophils Relative: 1.3 % (ref 0.0–3.0)
Eosinophils Absolute: 0.3 10*3/uL (ref 0.0–0.7)
Eosinophils Relative: 4.1 % (ref 0.0–5.0)
HCT: 42 % (ref 39.0–52.0)
Hemoglobin: 13.8 g/dL (ref 13.0–17.0)
Lymphocytes Relative: 39.5 % (ref 12.0–46.0)
Lymphs Abs: 2.6 10*3/uL (ref 0.7–4.0)
MCHC: 32.8 g/dL (ref 30.0–36.0)
MCV: 87.2 fl (ref 78.0–100.0)
Monocytes Absolute: 0.7 10*3/uL (ref 0.1–1.0)
Monocytes Relative: 10.4 % (ref 3.0–12.0)
Neutro Abs: 3 10*3/uL (ref 1.4–7.7)
Neutrophils Relative %: 44.7 % (ref 43.0–77.0)
Platelets: 202 10*3/uL (ref 150.0–400.0)
RBC: 4.82 Mil/uL (ref 4.22–5.81)
RDW: 15.4 % (ref 11.5–15.5)
WBC: 6.7 10*3/uL (ref 4.0–10.5)

## 2021-10-06 MED ORDER — ATORVASTATIN CALCIUM 40 MG PO TABS: 40.0000 mg | ORAL_TABLET | Freq: Every day | ORAL | 3 refills | Status: DC

## 2021-10-06 MED ORDER — AMLODIPINE BESYLATE 10 MG PO TABS: 10.0000 mg | ORAL_TABLET | Freq: Every day | ORAL | 3 refills | Status: DC

## 2021-10-06 MED ORDER — TADALAFIL 20 MG PO TABS: ORAL_TABLET | ORAL | 11 refills | Status: DC

## 2021-10-06 MED ORDER — LISINOPRIL 40 MG PO TABS: ORAL_TABLET | ORAL | 3 refills | Status: DC

## 2021-10-06 MED ORDER — TRIAMCINOLONE ACETONIDE 55 MCG/ACT NA AERO: 2.0000 | INHALATION_SPRAY | Freq: Every day | NASAL | 12 refills | Status: AC

## 2021-10-06 NOTE — Patient Instructions (Signed)
Please continue all other medications as before, and refills have been done if requested.  Please have the pharmacy call with any other refills you may need.  Please continue your efforts at being more active, low cholesterol diet, and weight control.  You are otherwise up to date with prevention measures today.  Please keep your appointments with your specialists as you may have planned  You will be contacted regarding the referral for: Dr Loreta Ave for colonoscopy  Please go to the LAB at the blood drawing area for the tests to be done  You will be contacted by phone if any changes need to be made immediately.  Otherwise, you will receive a letter about your results with an explanation, but please check with MyChart first.  Please remember to sign up for MyChart if you have not done so, as this will be important to you in the future with finding out test results, communicating by private email, and scheduling acute appointments online when needed.  Please make an Appointment to return for your 1 year visit, or sooner if needed, with Lab testing by Appointment as well, to be done about 3-5 days before at the FIRST FLOOR Lab (so this is for TWO appointments - please see the scheduling desk as you leave)   Due to the ongoing Covid 19 pandemic, our lab now requires an appointment for any labs done at our office.  If you need labs done and do not have an appointment, please call our office ahead of time to schedule before presenting to the lab for your testing.

## 2021-10-06 NOTE — Progress Notes (Signed)
Patient ID: Chad Murphy, male   DOB: 1960-12-01, 61 y.o.   MRN: [id number]         Chief Complaint:: wellness exam and hx of colon polyp, hyperglycemia, ckd, htn, hld, low vit d       HPI:  Chad PlantsCharles R Murphy is a 61 y.o. male here for wellness exam; will need referral back to dr [facility] for f/u colonoscopy, declines covid booster, shingrix, o/w up to date                        Also Pt denies chest pain, increased sob or doe, wheezing, orthopnea, PND, increased LE swelling, palpitations, dizziness or syncope.   Pt denies polydipsia, polyuria, or new focal neuro s/s.    Pt denies fever, wt loss, night sweats, loss of appetite, or other constitutional symptoms  Denies worsening reflux, abd pain, dysphagia, n/v, bowel change or blood.  No other new complaints   Wt Readings from Last 3 Encounters:  10/06/21 241 lb (109.3 kg)  04/05/21 232 lb (105.2 kg)  10/18/20 239 lb (108.4 kg)   BP Readings from Last 3 Encounters:  10/06/21 126/76  04/05/21 120/78  10/18/20 138/82   Immunization History  Administered Date(s) Administered   Influenza Split 04/18/2011, 03/13/2012   Influenza Whole 02/09/2010   Influenza,inj,Quad PF,6+ Mos 02/12/2014, 02/29/2016   Influenza-Unspecified 03/07/2020, 02/27/2021   PFIZER(Purple Top)SARS-COV-2 Vaccination 08/21/2019, 09/15/2019, 04/05/2020, 02/27/2021   Td 05/08/2006   Tdap 10/18/2020   Health Maintenance Due  Topic Date Due   COLONOSCOPY (Pts [phone] Insurance coverage will need to be confirmed)  03/13/2021      Past Medical History:  Diagnosis Date   History of low back pain    Hyperlipidemia    Hypertension, essential    Scrotal lesion    Past Surgical History:  Procedure Laterality Date   EXCISION EPIDERMOID CYST, ABDOMINAL WALL  05/19/2003   KNEE ARTHROSCOPY Left 05/08/1998   SCROTAL EXPLORATION N/A 12/17/2012   Procedure: EXCISION OF SCROTAL LESION;  Surgeon: Martina SinnerScott A MacDiarmid, MD;  [facility];   Service: Urology;  Laterality: N/A;   TOTAL HIP ARTHROPLASTY Left    WRIST GANGLION EXCISION Left 05/08/2000    reports that he quit smoking about 28 years ago. His smoking use included cigarettes. He has a 3.00 pack-year smoking history. He has never used smokeless tobacco. He reports current alcohol use. He reports that he does not use drugs. family history includes Allergies in an other family member; Arthritis in an other family member; Cancer in his sister; Diabetes in an other family member; Hypertension in an other family member. No Known Allergies Current Outpatient Medications on File Prior to Visit  Medication Sig Dispense Refill   aspirin EC 81 MG tablet Take 1 tablet (81 mg total) by mouth daily. 90 tablet 11   Cholecalciferol (THERA-D 2000) 50 MCG (2000 UT) TABS 1 tab by mouth once daily 30 tablet 99   Multiple Vitamin (MULTIVITAMIN) tablet Take 1 tablet by mouth daily.     naproxen (NAPROSYN) 500 MG tablet TAKE 1 TABLET BY MOUTH TWICE DAILY WITH A MEAL AS NEEDED FOR PAIN 60 tablet 3   No current facility-administered medications on file prior to visit.        ROS:  All others reviewed and negative.  Objective        PE:  BP 126/76 (BP Location: Right Arm, Patient Position: Sitting, Cuff Size: Large)   Pulse  92   Temp 98.2 F (36.8 C) (Oral)   Ht 5\' 8"  (1.727 m)   Wt 241 lb (109.3 kg)   SpO2 95%   BMI 36.64 kg/m                 Constitutional: Pt appears in NAD               HENT: Head: NCAT.                Right Ear: External ear normal.                 Left Ear: External ear normal.                Eyes: . Pupils are equal, round, and reactive to light. Conjunctivae and EOM are normal               Nose: without d/c or deformity               Neck: Neck supple. Gross normal ROM               Cardiovascular: Normal rate and regular rhythm.                 Pulmonary/Chest: Effort normal and breath sounds without rales or wheezing.                Abd:  Soft, NT, ND,  + BS, no organomegaly               Neurological: Pt is alert. At baseline orientation, motor grossly intact               Skin: Skin is warm. No rashes, no other new lesions, LE edema - none               Psychiatric: Pt behavior is normal without agitation   Micro: none  Cardiac tracings I have personally interpreted today:  none  Pertinent Radiological findings (summarize): none   Lab Results  Component Value Date   WBC 6.7 10/06/2021   HGB 13.8 10/06/2021   HCT 42.0 10/06/2021   PLT 202.0 10/06/2021   GLUCOSE 84 10/06/2021   CHOL 148 10/06/2021   TRIG 88.0 10/06/2021   HDL 56.20 10/06/2021   LDLDIRECT 144.5 11/28/2007   LDLCALC 74 10/06/2021   ALT 20 10/06/2021   AST 18 10/06/2021   NA 140 10/06/2021   K 4.2 10/06/2021   CL 106 10/06/2021   CREATININE 1.43 10/06/2021   BUN 19 10/06/2021   CO2 24 10/06/2021   TSH 0.74 10/06/2021   PSA 1.71 10/06/2021   HGBA1C 5.9 10/06/2021   Assessment/Plan:  Chad Murphy is a 61 y.o. Black or African American [2] male with  has a past medical history of History of low back pain, Hyperlipidemia, Hypertension, essential, and Scrotal lesion.  Vitamin D deficiency Last vitamin D Lab Results  Component Value Date   VD25OH 48.73 10/06/2021   Stable, cont oral replacement   Impaired glucose tolerance Lab Results  Component Value Date   HGBA1C 5.9 10/06/2021   Stable, pt to continue current medical treatment  - diet   Hypertension BP Readings from Last 3 Encounters:  10/06/21 126/76  04/05/21 120/78  10/18/20 138/82   Stable, pt to continue medical treatment norvasc 10mg   lisinopril 40 mg qd   Hyperlipidemia Lab Results  Component Value Date   LDLCALC 74 10/06/2021   Uncontrolled, goal ldl < 70 in  light of ckd,, pt to continue current statin lipitor 40 mg qd as declines change for now, for lower chol diet  CKD (chronic kidney disease) Recent onset, mild, consider add farxiga,  to f/u any worsening symptoms  or concerns, f/u lab next visti  History of colon polyps Pt now due for f/u colonscopy at 7 yrs - refer back to Dr Loreta Ave, GI  Encounter for well adult exam with abnormal findings Age and sex appropriate education and counseling updated with regular exercise and diet Referrals for preventative services - for colonoscoyp with Dr Loreta Ave Immunizations addressed - declines covid booster, shingrix Smoking counseling  - none needed Evidence for depression or other mood disorder - none significant Most recent labs reviewed. I have personally reviewed and have noted: 1) the patient's medical and social history 2) The patient's current medications and supplements 3) The patient's height, weight, and BMI have been recorded in the chart  Followup: Return in about 1 year (around 10/07/2022).  Oliver Barre, MD 10/09/2021 8:56 AM [facility]

## 2021-10-07 LAB — URINALYSIS, ROUTINE W REFLEX MICROSCOPIC
Bilirubin Urine: NEGATIVE
Hgb urine dipstick: NEGATIVE
Ketones, ur: NEGATIVE
Leukocytes,Ua: NEGATIVE
Nitrite: NEGATIVE
RBC / HPF: NONE SEEN (ref 0–?)
Specific Gravity, Urine: 1.02 (ref 1.000–1.030)
Total Protein, Urine: NEGATIVE
Urine Glucose: NEGATIVE
Urobilinogen, UA: 0.2 (ref 0.0–1.0)
pH: 6 (ref 5.0–8.0)

## 2021-10-07 LAB — HEPATIC FUNCTION PANEL
ALT: 20 U/L (ref 0–53)
AST: 18 U/L (ref 0–37)
Albumin: 4.2 g/dL (ref 3.5–5.2)
Alkaline Phosphatase: 94 U/L (ref 39–117)
Bilirubin, Direct: 0.1 mg/dL (ref 0.0–0.3)
Total Bilirubin: 0.3 mg/dL (ref 0.2–1.2)
Total Protein: 7.3 g/dL (ref 6.0–8.3)

## 2021-10-07 LAB — PSA: PSA: 1.71 ng/mL (ref 0.10–4.00)

## 2021-10-07 LAB — LIPID PANEL
Cholesterol: 148 mg/dL (ref 0–200)
HDL: 56.2 mg/dL (ref >39.00)
LDL Cholesterol: 74 mg/dL (ref 0–99)
NonHDL: 91.87
Total CHOL/HDL Ratio: 3
Triglycerides: 88 mg/dL (ref 0.0–149.0)
VLDL: 17.6 mg/dL (ref 0.0–40.0)

## 2021-10-07 LAB — BASIC METABOLIC PANEL
BUN: 19 mg/dL (ref 6–23)
CO2: 24 mEq/L (ref 19–32)
Calcium: 9.6 mg/dL (ref 8.4–10.5)
Chloride: 106 mEq/L (ref 96–112)
Creatinine, Ser: 1.43 mg/dL (ref 0.40–1.50)
GFR: 53.12 mL/min — ABNORMAL LOW (ref >60.00)
Glucose, Bld: 84 mg/dL (ref 70–99)
Potassium: 4.2 mEq/L (ref 3.5–5.1)
Sodium: 140 mEq/L (ref 135–145)

## 2021-10-07 LAB — HEMOGLOBIN A1C: Hgb A1c MFr Bld: 5.9 % (ref 4.6–6.5)

## 2021-10-07 LAB — VITAMIN D 25 HYDROXY (VIT D DEFICIENCY, FRACTURES): VITD: 48.73 ng/mL (ref 30.00–100.00)

## 2021-10-07 LAB — TSH: TSH: 0.74 u[IU]/mL (ref 0.35–5.50)

## 2021-10-07 LAB — VITAMIN B12: Vitamin B-12: 592 pg/mL (ref 211–911)

## 2021-10-09 ENCOUNTER — Encounter: Payer: Self-pay | Admitting: Internal Medicine

## 2021-10-09 DIAGNOSIS — N189 Chronic kidney disease, unspecified: Secondary | ICD-10-CM | POA: Insufficient documentation

## 2021-10-09 DIAGNOSIS — Z86010 Personal history of colonic polyps: Secondary | ICD-10-CM | POA: Insufficient documentation

## 2021-10-09 NOTE — Assessment & Plan Note (Signed)
Lab Results  Component Value Date   HGBA1C 5.9 10/06/2021   Stable, pt to continue current medical treatment  - diet

## 2021-10-09 NOTE — Assessment & Plan Note (Signed)
Pt now due for f/u colonscopy at 7 yrs - refer back to Dr Loreta Ave, GI

## 2021-10-09 NOTE — Assessment & Plan Note (Signed)
Lab Results  Component Value Date   LDLCALC 74 10/06/2021   Uncontrolled, goal ldl < 70 in light of ckd,, pt to continue current statin lipitor 40 mg qd as declines change for now, for lower chol diet

## 2021-10-09 NOTE — Assessment & Plan Note (Signed)
BP Readings from Last 3 Encounters:  10/06/21 126/76  04/05/21 120/78  10/18/20 138/82   Stable, pt to continue medical treatment norvasc 10mg   lisinopril 40 mg qd

## 2021-10-09 NOTE — Assessment & Plan Note (Signed)
Age and sex appropriate education and counseling updated with regular exercise and diet Referrals for preventative services - for colonoscoyp with Dr Loreta Ave Immunizations addressed - declines covid booster, shingrix Smoking counseling  - none needed Evidence for depression or other mood disorder - none significant Most recent labs reviewed. I have personally reviewed and have noted: 1) the patient's medical and social history 2) The patient's current medications and supplements 3) The patient's height, weight, and BMI have been recorded in the chart

## 2021-10-09 NOTE — Assessment & Plan Note (Addendum)
Recent onset, mild, consider add farxiga,  to f/u any worsening symptoms or concerns, f/u lab next visti

## 2021-10-09 NOTE — Assessment & Plan Note (Signed)
Last vitamin D Lab Results  Component Value Date   VD25OH 48.73 10/06/2021   Stable, cont oral replacement

## 2021-10-25 NOTE — Progress Notes (Unsigned)
Chad Murphy [address] [place] [address] Phone: [phone] Subjective:   Chad Murphy, am serving as a scribe for Dr. Antoine Murphy.  I'm seeing this patient by the request  of:  Chad Levins, MD  CC: Knee pain  [id number]  Last seen in 2016  Chad Murphy is a 61 y.o. male coming in with complaint of left knee. May want injection.  We have seen patient for quite some time.  Responded to injections as well as viscosupplementation in the past.  Last visit was 2016. Left hip replaced wants knee to feel good as well.    Patient did have x-rays in 2016 of the knee showing the patient did have moderate to severe medial compartment arthritic changes noted.    Past Medical History:  Diagnosis Date   History of low back pain    Hyperlipidemia    Hypertension, essential    Scrotal lesion    Past Surgical History:  Procedure Laterality Date   EXCISION EPIDERMOID CYST, ABDOMINAL WALL  05/19/2003   KNEE ARTHROSCOPY Left 05/08/1998   SCROTAL EXPLORATION N/A 12/17/2012   Procedure: EXCISION OF SCROTAL LESION;  Surgeon: Chad Sinner, MD;  Location: [facility] ;  Service: Urology;  Laterality: N/A;   TOTAL HIP ARTHROPLASTY Left    WRIST GANGLION EXCISION Left 05/08/2000   Social History   Socioeconomic History   Marital status: Single    Spouse name: Not on file   Number of children: 2   Years of education: Not on file   Highest education level: Not on file  Occupational History   Occupation: makes medical kits with [employer]  Tobacco Use   Smoking status: Former    Packs/day: 0.50    Years: 6.00    Total pack years: 3.00    Types: Cigarettes    Quit date: 12/13/1992    Years since quitting: 28.8   Smokeless tobacco: Never  Substance and Sexual Activity   Alcohol use: Yes    Comment: 0CCASIONAL   Drug use: No   Sexual activity: Not on file  Other Topics Concern   Not on file  Social History  Narrative   No biological children/2 step sons   Social Determinants of Health   Financial Resource Strain: Not on file  Food Insecurity: Not on file  Transportation Needs: Not on file  Physical Activity: Not on file  Stress: Not on file  Social Connections: Not on file   No Known Allergies Family History  Problem Relation Age of Onset   Cancer Sister        Breast Cancer and with non-colon cancer   Diabetes Other    Hypertension Other    Arthritis Other    Allergies Other      Current Outpatient Medications (Cardiovascular):    amLODipine (NORVASC) 10 MG tablet, Take 1 tablet (10 mg total) by mouth daily.   atorvastatin (LIPITOR) 40 MG tablet, Take 1 tablet (40 mg total) by mouth daily.   lisinopril (ZESTRIL) 40 MG tablet, TAKE 1 TABLET BY MOUTH EVERY DAY IN THE EVENING   tadalafil (CIALIS) 20 MG tablet, TAKE 1 TABLET BY MOUTH DAILY AS NEEDED FOR ERECTILE DYSFUNCTION  Current Outpatient Medications (Respiratory):    triamcinolone (NASACORT AQ) 55 MCG/ACT AERO nasal inhaler, Place 2 sprays into the nose daily.  Current Outpatient Medications (Analgesics):    aspirin EC 81 MG tablet, Take 1 tablet (81 mg total) by mouth daily.  naproxen (NAPROSYN) 500 MG tablet, TAKE 1 TABLET BY MOUTH TWICE DAILY WITH A MEAL AS NEEDED FOR PAIN   Current Outpatient Medications (Other):    Cholecalciferol (THERA-D 2000) 50 MCG (2000 UT) TABS, 1 tab by mouth once daily   Multiple Vitamin (MULTIVITAMIN) tablet, Take 1 tablet by mouth daily.   Reviewed prior external information including notes and imaging from  primary care provider As well as notes that were available from care everywhere and other healthcare systems.  Past medical history, social, surgical and family history all reviewed in electronic medical record.  No pertanent information unless stated regarding to the chief complaint.   Review of Systems:  No headache, visual changes, nausea, vomiting, diarrhea, constipation,  dizziness, abdominal pain, skin rash, fevers, chills, night sweats, weight loss, swollen lymph nodes, body aches, joint swelling, chest pain, shortness of breath, mood changes. POSITIVE muscle aches  Objective  Blood pressure 136/82, pulse 97, height 5\' 8"  (1.727 m), weight 240 lb (108.9 kg), SpO2 97 %.   General: No apparent distress alert and oriented x3 mood and affect normal, dressed appropriately.  HEENT: Pupils equal, extraocular movements intact  Respiratory: Patient's speak in full sentences and does not appear short of breath  Cardiovascular: No lower extremity edema, non tender, no erythema  Antalgic gait noted.  Patient continues to have some difficulty more with the varus deformity of the knee.  Instability with valgus and varus force.  Trace effusion noted of the patellofemoral joint.  After informed written and verbal consent, patient was seated on exam table. Left knee was prepped with alcohol swab and utilizing anterolateral approach, patient's left knee space was injected with 4:1  marcaine 0.5%: Kenalog 40mg /dL. Patient tolerated the procedure well without immediate complications.    Impression and Recommendations:    The above documentation has been reviewed and is accurate and complete , DO

## 2021-10-26 ENCOUNTER — Ambulatory Visit: Payer: BC Managed Care – PPO | Admitting: Family Medicine

## 2021-10-26 ENCOUNTER — Encounter: Payer: Self-pay | Admitting: Family Medicine

## 2021-10-26 DIAGNOSIS — M1712 Unilateral primary osteoarthritis, left knee: Secondary | ICD-10-CM

## 2021-10-26 NOTE — Assessment & Plan Note (Addendum)
Patient has likely severe arthritic changes of the left knee.  Due to the instability as well as the abnormal thigh to calf ratio I do believe the patient would do really well with a over a stability brace.  The patient would like to avoid surgery if possible.  Discussed icing regimen and home exercises otherwise.  Patient wants to avoid any of the viscosupplementation but would consider repeat injections when needed.  Due to patient having severe arthritic changes even greater than 7 years ago patient has declined any x-rays because he does not think it would change medical management.

## 2021-10-26 NOTE — Patient Instructions (Signed)
Injection in left knee today Irena Cords rep will reach out to you about brace See you again in 3 months

## 2022-06-04 IMAGING — MR MR SHOULDER*R* W/O CM
4 of 5 series · 20 of 40 positions shown · non-contrast
Comparison: X-ray shoulder 04/19/2021.

CLINICAL DATA: Right shoulder/arm pain with weakness related to
lifting injury 2 months ago. Technologist note reports torn biceps
tendon. Clinical concern for rotator cuff or labral tear.

EXAM:
MRI OF THE RIGHT SHOULDER WITHOUT CONTRAST
TECHNIQUE: Multiplanar, multisequence MR imaging of the shoulder was performed.
No intravenous contrast was administered.

[Series 6: T2 fat-sat · axial · right · 3.0mm · 0.47mm/px · z∈[-80,+18]mm · 7 of 30 slices shown (1 of 3)]
[im 1/30]
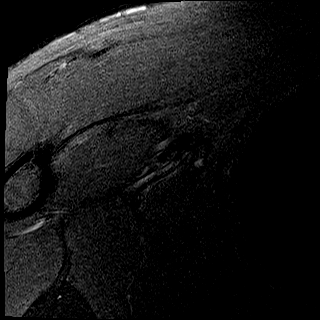
[im 4/30]
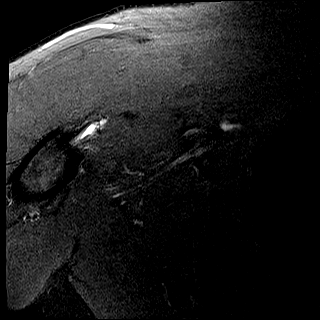
[im 10/30]
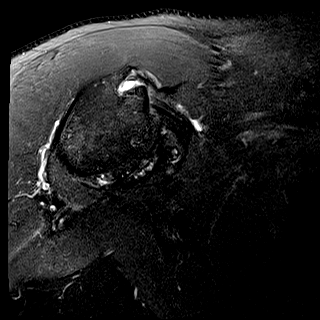
[im 13/30]
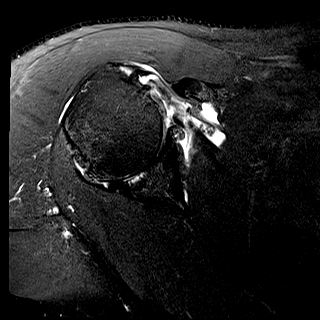
[im 17/30]
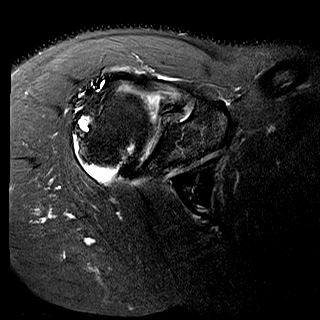
[im 20/30]
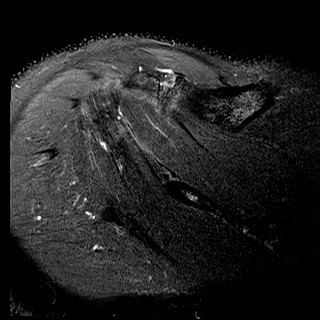
[im 26/30]
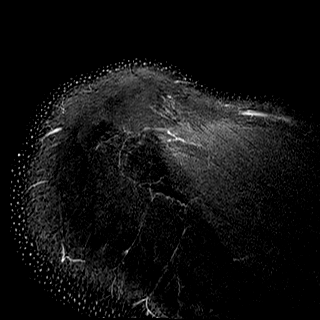

[Series 7: T2 fat-sat · oblique · right · 4.0mm · 0.22mm/px · 3 of 21 slices shown (2 of 3)]
[im 4/21]
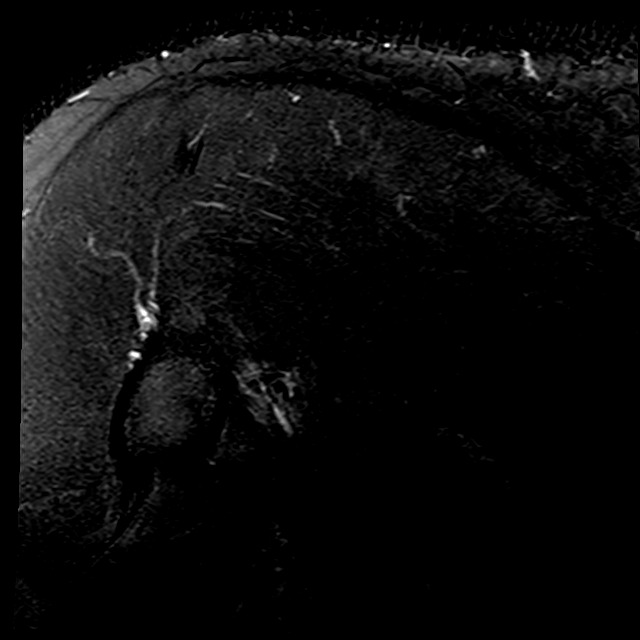
[im 11/21]
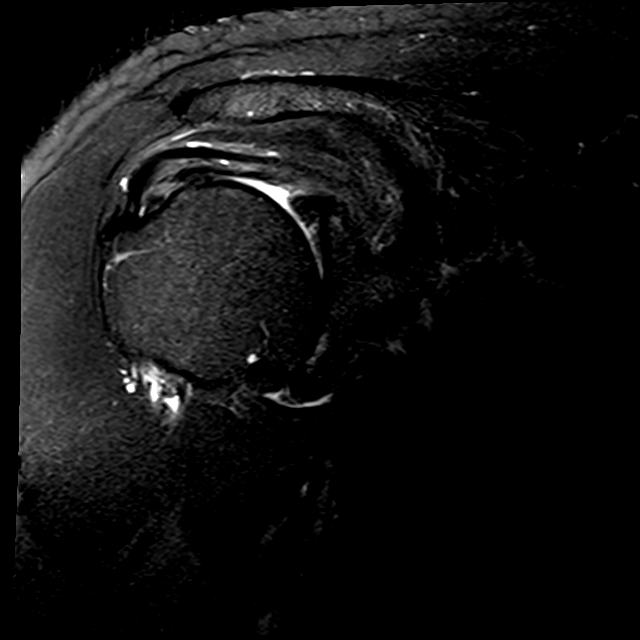
[im 17/21]
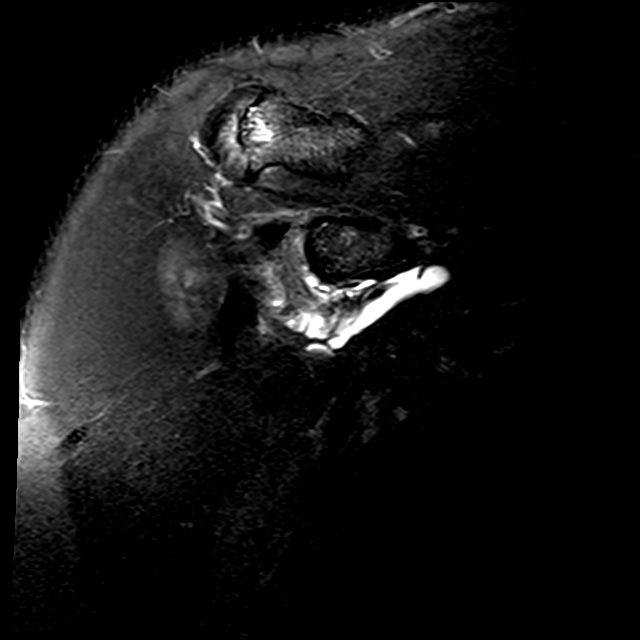

[Series 8: PD · oblique · right · 4.0mm · 0.22mm/px · 7 of 21 slices shown]
[im 1/21]
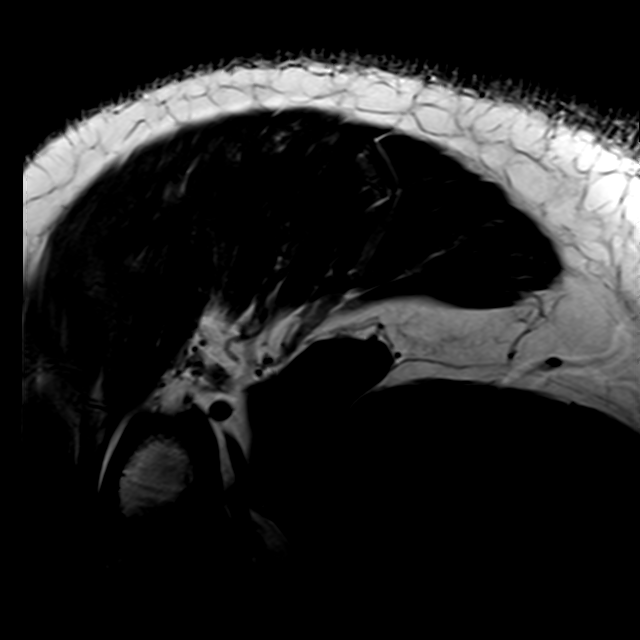
[im 4/21]
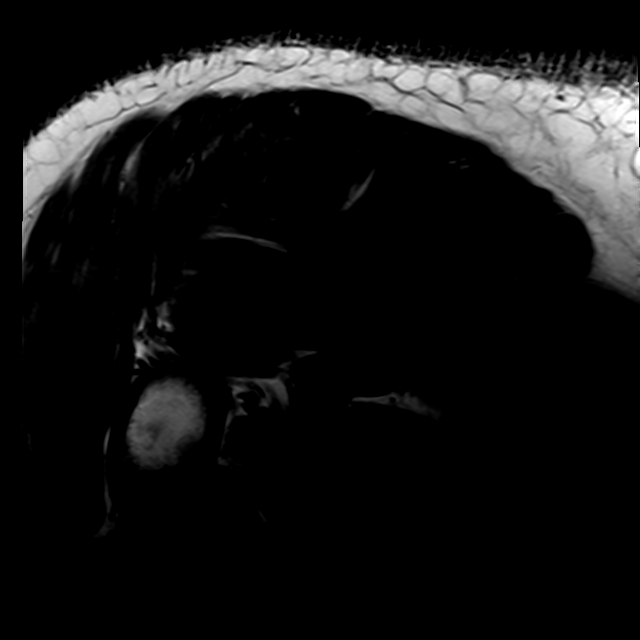
[im 7/21]
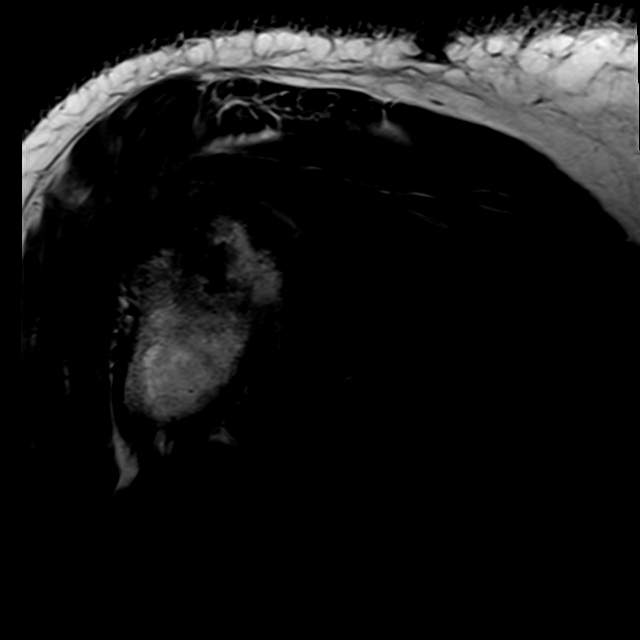
[im 11/21]
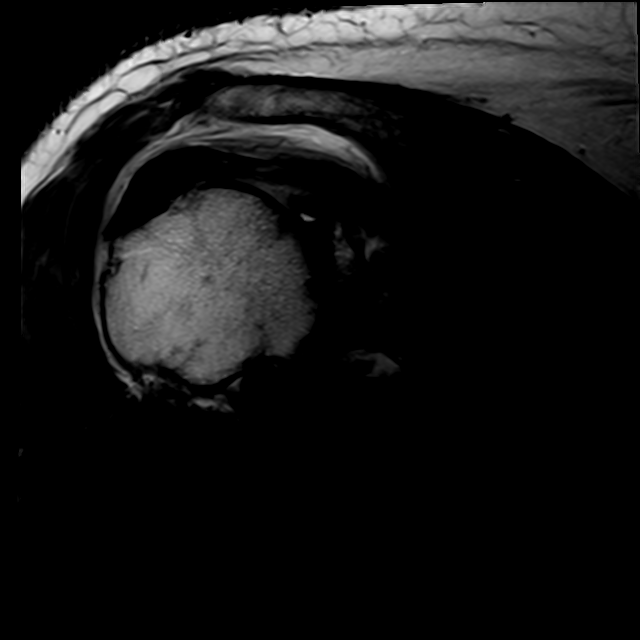
[im 14/21]
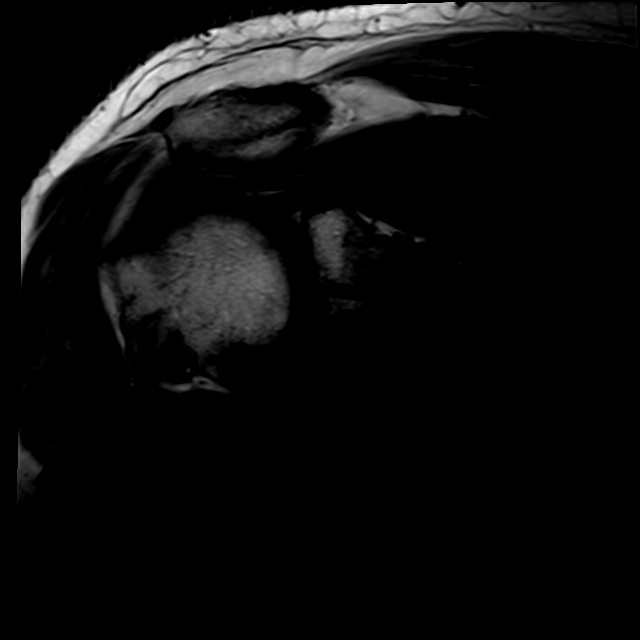
[im 17/21]
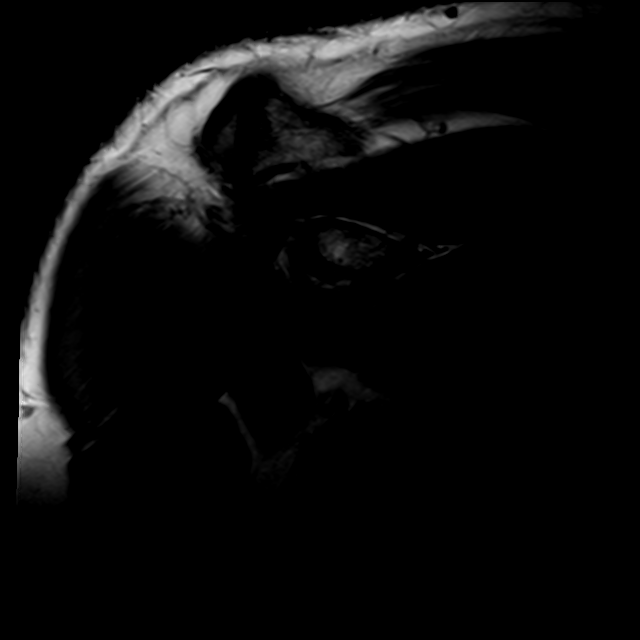
[im 21/21]
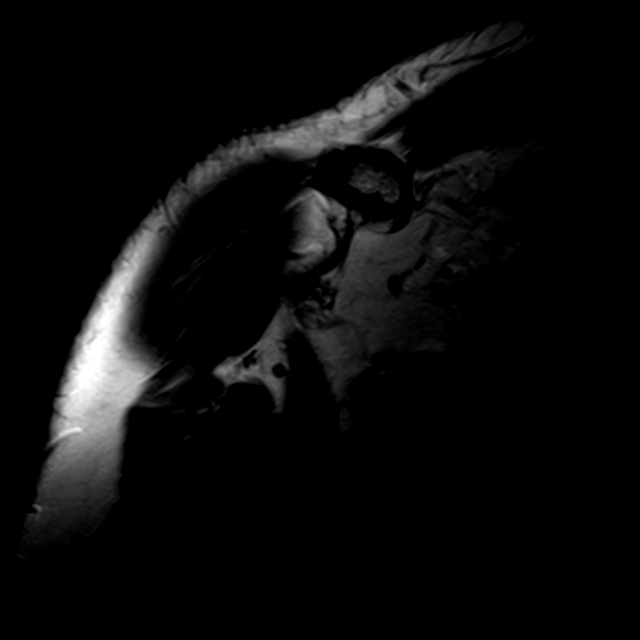

[Series 9: T2 fat-sat · oblique · right · 4.0mm · 0.44mm/px · 3 of 23 slices shown (3 of 3)]
[im 4/23]
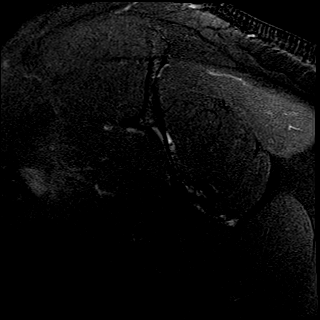
[im 13/23]
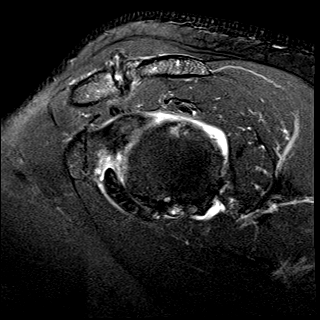
[im 19/23]
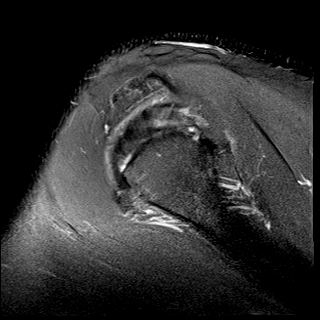

[20 of 40 positions shown; findings below may reference images not displayed]

FINDINGS: Rotator cuff: Moderate-severe rotator cuff tendinosis with
partial-thickness insertional and interstitial tears of the distal
supraspinatus, infraspinatus, and subscapularis tendons. No
full-thickness or retracted tear.

Muscles: Preserved bulk and signal intensity of the rotator cuff
musculature without edema, atrophy, or fatty infiltration.

Biceps long head: Not visualized, likely torn and retracted. Loose
bodies seen within the biceps tendon sheath measuring up to 12 mm.

Acromioclavicular Joint: Moderate-severe degenerative changes of the
AC joint. No significant subacromial-subdeltoid bursal fluid.

Glenohumeral Joint: Severe glenohumeral joint osteoarthritis with
extensive full-thickness cartilage loss along both sides of the
joint. Small subchondral cystic changes throughout. Bulky humeral
head marginal osteophytes. Trace joint effusion.

Labrum:  Circumferentially degenerated.

Bones: No acute fracture. No dislocation. No bone marrow edema. No
marrow replacing bone lesion.

Other: None.
IMPRESSION: 1. Moderate-severe rotator cuff tendinosis with partial-thickness
insertional and interstitial tears of the distal supraspinatus,
infraspinatus, and subscapularis tendons. No full-thickness or
retracted tear.
2. Nonvisualization of the long head of the biceps tendon, likely
torn and retracted. Loose bodies seen within the biceps tendon
sheath measuring up to 12 mm.
3. Severe glenohumeral and moderate-severe AC joint osteoarthritis.

## 2022-08-22 ENCOUNTER — Other Ambulatory Visit: Payer: Self-pay | Admitting: Internal Medicine

## 2022-10-09 ENCOUNTER — Ambulatory Visit (INDEPENDENT_AMBULATORY_CARE_PROVIDER_SITE_OTHER): Payer: BC Managed Care – PPO | Admitting: Internal Medicine

## 2022-10-09 ENCOUNTER — Encounter: Payer: Self-pay | Admitting: Internal Medicine

## 2022-10-09 ENCOUNTER — Encounter: Payer: BC Managed Care – PPO | Admitting: Internal Medicine

## 2022-10-09 VITALS — BP 122/78 | HR 90 | Temp 99.9°F | Ht 68.0 in | Wt 234.0 lb

## 2022-10-09 DIAGNOSIS — E559 Vitamin D deficiency, unspecified: Secondary | ICD-10-CM | POA: Diagnosis not present

## 2022-10-09 DIAGNOSIS — E7800 Pure hypercholesterolemia, unspecified: Secondary | ICD-10-CM | POA: Diagnosis not present

## 2022-10-09 DIAGNOSIS — R7302 Impaired glucose tolerance (oral): Secondary | ICD-10-CM

## 2022-10-09 DIAGNOSIS — J069 Acute upper respiratory infection, unspecified: Secondary | ICD-10-CM

## 2022-10-09 DIAGNOSIS — Z125 Encounter for screening for malignant neoplasm of prostate: Secondary | ICD-10-CM | POA: Diagnosis not present

## 2022-10-09 DIAGNOSIS — J309 Allergic rhinitis, unspecified: Secondary | ICD-10-CM | POA: Diagnosis not present

## 2022-10-09 DIAGNOSIS — I10 Essential (primary) hypertension: Secondary | ICD-10-CM

## 2022-10-09 DIAGNOSIS — E538 Deficiency of other specified B group vitamins: Secondary | ICD-10-CM | POA: Diagnosis not present

## 2022-10-09 DIAGNOSIS — K439 Ventral hernia without obstruction or gangrene: Secondary | ICD-10-CM | POA: Insufficient documentation

## 2022-10-09 DIAGNOSIS — Z0001 Encounter for general adult medical examination with abnormal findings: Secondary | ICD-10-CM

## 2022-10-09 DIAGNOSIS — J302 Other seasonal allergic rhinitis: Secondary | ICD-10-CM | POA: Diagnosis not present

## 2022-10-09 DIAGNOSIS — N1831 Chronic kidney disease, stage 3a: Secondary | ICD-10-CM

## 2022-10-09 MED ORDER — AZITHROMYCIN 250 MG PO TABS: ORAL_TABLET | ORAL | 1 refills | Status: AC

## 2022-10-09 MED ORDER — PREDNISONE 10 MG PO TABS: ORAL_TABLET | ORAL | 0 refills | Status: DC

## 2022-10-09 NOTE — Progress Notes (Signed)
Patient ID: Chad Murphy, male   DOB: [birth year], 62 y.o.   MRN: [id number]         Chief Complaint:: wellness exam and acute upper resp infection, allergies, hyperglycemia, hld, low vit d, ckd3a, htn       HPI:  Chad Murphy is a 62 y.o. male here for wellness exam; had colonoscopy 2023 with hemorrhoids, to f/u Dr Loreta Ave for treatment soon.o/w up to date                        Also Does have several wks ongoing nasal allergy symptoms with clearish congestion, itch and sneezing, without fever, pain, ST, cough, swelling or wheezing.   Here with 2-3 days acute onset fever, facial pain, pressure, headache, general weakness and malaise, and greenish d/c, with mild ST and cough, but pt denies chest pain, wheezing, increased sob or doe, orthopnea, PND, increased LE swelling, palpitations, dizziness or syncope.   Pt denies polydipsia, polyuria, or new focal neuro s/s.    Pt denies night sweats, loss of appetite, or other constitutional symptoms    Wt Readings from Last 3 Encounters:  10/09/22 234 lb (106.1 kg)  10/26/21 240 lb (108.9 kg)  10/06/21 241 lb (109.3 kg)   BP Readings from Last 3 Encounters:  10/09/22 122/78  10/26/21 136/82  10/06/21 126/76   Immunization History  Administered Date(s) Administered   Influenza Split 04/18/2011, 03/13/2012   Influenza Whole 02/09/2010   Influenza,inj,Quad PF,6+ Mos 02/12/2014, 02/29/2016   Influenza-Unspecified 03/07/2020, 02/27/2021   PFIZER(Purple Top)SARS-COV-2 Vaccination 08/21/2019, 09/15/2019, 04/05/2020, 02/27/2021   Td 05/08/2006   Tdap 10/18/2020   Zoster Recombinat (Shingrix) 03/13/2022, 05/15/2022   There are no preventive care reminders to display for this patient.     Past Medical History:  Diagnosis Date   History of low back pain    Hyperlipidemia    Hypertension, essential    Scrotal lesion    Past Surgical History:  Procedure Laterality Date   EXCISION EPIDERMOID CYST, ABDOMINAL WALL  05/19/2003   KNEE  ARTHROSCOPY Left 05/08/1998   SCROTAL EXPLORATION N/A 12/17/2012   Procedure: EXCISION OF SCROTAL LESION;  Surgeon: Martina Sinner, MD;  Location: [facility];  Service: Urology;  Laterality: N/A;   TOTAL HIP ARTHROPLASTY Left    WRIST GANGLION EXCISION Left 05/08/2000    reports that he quit smoking about 29 years ago. His smoking use included cigarettes. He has a 3.00 pack-year smoking history. He has never used smokeless tobacco. He reports current alcohol use. He reports that he does not use drugs. family history includes Allergies in an other family member; Arthritis in an other family member; Cancer in his sister; Diabetes in an other family member; Hypertension in an other family member. No Known Allergies Current Outpatient Medications on File Prior to Visit  Medication Sig Dispense Refill   amLODipine (NORVASC) 10 MG tablet TAKE 1 TABLET BY MOUTH EVERY DAY 90 tablet 1   aspirin EC 81 MG tablet Take 1 tablet (81 mg total) by mouth daily. 90 tablet 11   atorvastatin (LIPITOR) 40 MG tablet Take 1 tablet (40 mg total) by mouth daily. 90 tablet 3   Cholecalciferol (THERA-D 2000) 50 MCG (2000 UT) TABS 1 tab by mouth once daily 30 tablet 99   lisinopril (ZESTRIL) 40 MG tablet TAKE 1 TABLET BY MOUTH EVERY DAY IN THE EVENING 90 tablet 3   Multiple Vitamin (MULTIVITAMIN) tablet Take 1 tablet by mouth  daily.     naproxen (NAPROSYN) 500 MG tablet TAKE 1 TABLET BY MOUTH TWICE DAILY WITH A MEAL AS NEEDED FOR PAIN 60 tablet 3   tadalafil (CIALIS) 20 MG tablet TAKE 1 TABLET BY MOUTH DAILY AS NEEDED FOR ERECTILE DYSFUNCTION 10 tablet 11   triamcinolone (NASACORT AQ) 55 MCG/ACT AERO nasal inhaler Place 2 sprays into the nose daily. 1 each 12   No current facility-administered medications on file prior to visit.        ROS:  All others reviewed and negative.  Objective        PE:  BP 122/78 (BP Location: Right Arm, Patient Position: Sitting, Cuff Size: Normal)   Pulse 90   Temp  99.9 F (37.7 C) (Oral)   Ht 5\' 8"  (1.727 m)   Wt 234 lb (106.1 kg)   SpO2 97%   BMI 35.58 kg/m                 Constitutional: Pt appears in NAD               HENT: Head: NCAT.                Right Ear: External ear normal.                 Left Ear: External ear normal. Bilat tm's with mild erythema.  Max sinus areas mild tender.  Pharynx with mild erythema, no exudate                Eyes: . Pupils are equal, round, and reactive to light. Conjunctivae and EOM are normal               Nose: without d/c or deformity               Neck: Neck supple. Gross normal ROM               Cardiovascular: Normal rate and regular rhythm.                 Pulmonary/Chest: Effort normal and breath sounds without rales or wheezing.                Abd:  Soft, NT, ND, + BS, no organomegaly               Neurological: Pt is alert. At baseline orientation, motor grossly intact               Skin: Skin is warm. No rashes, no other new lesions, LE edema none               Psychiatric: Pt behavior is normal without agitation   Micro: none  Cardiac tracings I have personally interpreted today:  none  Pertinent Radiological findings (summarize): none   Lab Results  Component Value Date   WBC 6.7 10/06/2021   HGB 13.8 10/06/2021   HCT 42.0 10/06/2021   PLT 202.0 10/06/2021   GLUCOSE 84 10/06/2021   CHOL 148 10/06/2021   TRIG 88.0 10/06/2021   HDL 56.20 10/06/2021   LDLDIRECT 144.5 11/28/2007   LDLCALC 74 10/06/2021   ALT 20 10/06/2021   AST 18 10/06/2021   NA 140 10/06/2021   K 4.2 10/06/2021   CL 106 10/06/2021   CREATININE 1.43 10/06/2021   BUN 19 10/06/2021   CO2 24 10/06/2021   TSH 0.74 10/06/2021   PSA 1.71 10/06/2021   HGBA1C 5.9 10/06/2021   Assessment/Plan:  Chad Murphy  is a 62 y.o. Black or African American [2] male with  has a past medical history of History of low back pain, Hyperlipidemia, Hypertension, essential, and Scrotal lesion.  Encounter for well adult exam  with abnormal findings Age and sex appropriate education and counseling updated with regular exercise and diet Referrals for preventative services - none needed Immunizations addressed - none needed Smoking counseling  - none needed Evidence for depression or other mood disorder - none significant Most recent labs reviewed. I have personally reviewed and have noted: 1) the patient's medical and social history 2) The patient's current medications and supplements 3) The patient's height, weight, and BMI have been recorded in the chart   Hyperlipidemia Lab Results  Component Value Date   LDLCALC 74 10/06/2021   Uncontrolled, goal ldl < 70, pt to continue current statin lipitor 40 mg , f/u lab today   Allergic rhinitis Mild to mod seasonal flare, for depomedrol IM 80 mg, predpac asd,  to f/u any worsening symptoms or concerns   Impaired glucose tolerance Lab Results  Component Value Date   HGBA1C 5.9 10/06/2021   Stable, pt to continue current medical treatment  - diet,wt control   Hypertension BP Readings from Last 3 Encounters:  10/09/22 122/78  10/26/21 136/82  10/06/21 126/76   Stable, pt to continue medical treatment norvasc 10 mg, lisionpril 40 mg qd   Vitamin D deficiency Last vitamin D Lab Results  Component Value Date   VD25OH 48.73 10/06/2021   Stable, cont oral replacement   CKD (chronic kidney disease) Lab Results  Component Value Date   CREATININE 1.43 10/06/2021   Stable overall, cont to avoid nephrotoxins   Acute upper respiratory infection Mild to mod, for antibx course zpack,  to f/u any worsening symptoms or concerns  Followup: Return in about 1 year (around 10/09/2023).  Oliver Barre, MD 10/09/2022 5:27 PM [facility] - [facility]

## 2022-10-09 NOTE — Assessment & Plan Note (Signed)
Lab Results  Component Value Date   CREATININE 1.43 10/06/2021   Stable overall, cont to avoid nephrotoxins

## 2022-10-09 NOTE — Assessment & Plan Note (Signed)

## 2022-10-09 NOTE — Assessment & Plan Note (Signed)
Lab Results  Component Value Date   HGBA1C 5.9 10/06/2021   Stable, pt to continue current medical treatment  - diet,wt control

## 2022-10-09 NOTE — Assessment & Plan Note (Signed)
Mild to mod, for antibx course zpack,  to f/u any worsening symptoms or concerns 

## 2022-10-09 NOTE — Patient Instructions (Signed)
Please take all new medication as prescribed - the antibiotic, and prednisone  You had the steroid shot today  Please continue all other medications as before, and refills have been done if requested.  Please have the pharmacy call with any other refills you may need.  Please continue your efforts at being more active, low cholesterol diet, and weight control.  You are otherwise up to date with prevention measures today.  Please keep your appointments with your specialists as you may have planned  Please go to the LAB at the blood drawing area for the tests to be done  You will be contacted by phone if any changes need to be made immediately.  Otherwise, you will receive a letter about your results with an explanation, but please check with MyChart first.  Please remember to sign up for MyChart if you have not done so, as this will be important to you in the future with finding out test results, communicating by private email, and scheduling acute appointments online when needed.  Please make an Appointment to return for your 1 year visit, or sooner if needed

## 2022-10-09 NOTE — Assessment & Plan Note (Signed)
Last vitamin D Lab Results  Component Value Date   VD25OH 48.73 10/06/2021   Stable, cont oral replacement  

## 2022-10-09 NOTE — Assessment & Plan Note (Signed)
Mild to mod seasonal flare, for depomedrol IM 80 mg, predpac asd,  to f/u any worsening symptoms or concerns

## 2022-10-09 NOTE — Assessment & Plan Note (Signed)
BP Readings from Last 3 Encounters:  10/09/22 122/78  10/26/21 136/82  10/06/21 126/76   Stable, pt to continue medical treatment norvasc 10 mg, lisionpril 40 mg qd

## 2022-10-09 NOTE — Assessment & Plan Note (Signed)
Lab Results  Component Value Date   LDLCALC 74 10/06/2021   Uncontrolled, goal ldl < 70, pt to continue current statin lipitor 40 mg , f/u lab today

## 2022-10-10 DIAGNOSIS — J309 Allergic rhinitis, unspecified: Secondary | ICD-10-CM | POA: Diagnosis not present

## 2022-10-10 LAB — CBC WITH DIFFERENTIAL/PLATELET
Basophils Absolute: 0.1 10*3/uL (ref 0.0–0.1)
Basophils Relative: 0.9 % (ref 0.0–3.0)
Eosinophils Absolute: 0.2 10*3/uL (ref 0.0–0.7)
Eosinophils Relative: 3.3 % (ref 0.0–5.0)
HCT: 40.8 % (ref 39.0–52.0)
Hemoglobin: 13.6 g/dL (ref 13.0–17.0)
Lymphocytes Relative: 29.8 % (ref 12.0–46.0)
Lymphs Abs: 1.6 10*3/uL (ref 0.7–4.0)
MCHC: 33.4 g/dL (ref 30.0–36.0)
MCV: 90.1 fl (ref 78.0–100.0)
Monocytes Absolute: 0.9 10*3/uL (ref 0.1–1.0)
Monocytes Relative: 16.5 % — ABNORMAL HIGH (ref 3.0–12.0)
Neutro Abs: 2.7 10*3/uL (ref 1.4–7.7)
Neutrophils Relative %: 49.5 % (ref 43.0–77.0)
Platelets: 178 10*3/uL (ref 150.0–400.0)
RBC: 4.53 Mil/uL (ref 4.22–5.81)
RDW: 14.6 % (ref 11.5–15.5)
WBC: 5.5 10*3/uL (ref 4.0–10.5)

## 2022-10-10 LAB — VITAMIN B12: Vitamin B-12: 442 pg/mL (ref 211–911)

## 2022-10-10 LAB — URINALYSIS, ROUTINE W REFLEX MICROSCOPIC
Bilirubin Urine: NEGATIVE
Hgb urine dipstick: NEGATIVE
Ketones, ur: NEGATIVE
Leukocytes,Ua: NEGATIVE
Nitrite: NEGATIVE
RBC / HPF: NONE SEEN (ref 0–?)
Specific Gravity, Urine: 1.015 (ref 1.000–1.030)
Total Protein, Urine: NEGATIVE
Urine Glucose: NEGATIVE
Urobilinogen, UA: 0.2 (ref 0.0–1.0)
pH: 6 (ref 5.0–8.0)

## 2022-10-10 LAB — BASIC METABOLIC PANEL
BUN: 19 mg/dL (ref 6–23)
CO2: 27 mEq/L (ref 19–32)
Calcium: 8.9 mg/dL (ref 8.4–10.5)
Chloride: 105 mEq/L (ref 96–112)
Creatinine, Ser: 1.5 mg/dL (ref 0.40–1.50)
GFR: 49.81 mL/min — ABNORMAL LOW (ref >60.00)
Glucose, Bld: 107 mg/dL — ABNORMAL HIGH (ref 70–99)
Potassium: 3.7 mEq/L (ref 3.5–5.1)
Sodium: 140 mEq/L (ref 135–145)

## 2022-10-10 LAB — VITAMIN D 25 HYDROXY (VIT D DEFICIENCY, FRACTURES): VITD: 35.48 ng/mL (ref 30.00–100.00)

## 2022-10-10 LAB — LIPID PANEL
Cholesterol: 132 mg/dL (ref 0–200)
HDL: 45.1 mg/dL (ref >39.00)
LDL Cholesterol: 66 mg/dL (ref 0–99)
NonHDL: 87.09
Total CHOL/HDL Ratio: 3
Triglycerides: 103 mg/dL (ref 0.0–149.0)
VLDL: 20.6 mg/dL (ref 0.0–40.0)

## 2022-10-10 LAB — HEPATIC FUNCTION PANEL
ALT: 28 U/L (ref 0–53)
AST: 21 U/L (ref 0–37)
Albumin: 4 g/dL (ref 3.5–5.2)
Alkaline Phosphatase: 80 U/L (ref 39–117)
Bilirubin, Direct: 0.1 mg/dL (ref 0.0–0.3)
Total Bilirubin: 0.4 mg/dL (ref 0.2–1.2)
Total Protein: 6.8 g/dL (ref 6.0–8.3)

## 2022-10-10 LAB — PSA: PSA: 2.84 ng/mL (ref 0.10–4.00)

## 2022-10-10 LAB — HEMOGLOBIN A1C: Hgb A1c MFr Bld: 5.8 % (ref 4.6–6.5)

## 2022-10-10 LAB — TSH: TSH: 0.54 u[IU]/mL (ref 0.35–5.50)

## 2022-10-10 MED ORDER — METHYLPREDNISOLONE ACETATE 80 MG/ML IJ SUSP
80.0000 mg | Freq: Once | INTRAMUSCULAR | Status: AC
Start: 2022-10-10 — End: 2022-10-10
  Administered 2022-10-10: 80 mg via INTRAMUSCULAR

## 2022-10-10 NOTE — Addendum Note (Signed)
Addended by: Racheal Patches on: 10/10/2022 11:35 AM   Modules accepted: Orders

## 2022-11-12 ENCOUNTER — Other Ambulatory Visit: Payer: Self-pay | Admitting: Internal Medicine

## 2022-11-13 ENCOUNTER — Other Ambulatory Visit: Payer: Self-pay

## 2023-03-12 ENCOUNTER — Encounter (INDEPENDENT_AMBULATORY_CARE_PROVIDER_SITE_OTHER): Payer: BC Managed Care – PPO | Admitting: Ophthalmology

## 2023-03-12 DIAGNOSIS — H43813 Vitreous degeneration, bilateral: Secondary | ICD-10-CM

## 2023-03-12 DIAGNOSIS — H35033 Hypertensive retinopathy, bilateral: Secondary | ICD-10-CM | POA: Diagnosis not present

## 2023-03-12 DIAGNOSIS — H3561 Retinal hemorrhage, right eye: Secondary | ICD-10-CM

## 2023-03-12 DIAGNOSIS — I10 Essential (primary) hypertension: Secondary | ICD-10-CM | POA: Diagnosis not present

## 2023-03-12 DIAGNOSIS — H353132 Nonexudative age-related macular degeneration, bilateral, intermediate dry stage: Secondary | ICD-10-CM

## 2023-03-12 DIAGNOSIS — H2513 Age-related nuclear cataract, bilateral: Secondary | ICD-10-CM

## 2023-04-23 ENCOUNTER — Encounter (INDEPENDENT_AMBULATORY_CARE_PROVIDER_SITE_OTHER): Payer: BC Managed Care – PPO | Admitting: Ophthalmology

## 2023-04-23 DIAGNOSIS — H43813 Vitreous degeneration, bilateral: Secondary | ICD-10-CM

## 2023-04-23 DIAGNOSIS — H353132 Nonexudative age-related macular degeneration, bilateral, intermediate dry stage: Secondary | ICD-10-CM

## 2023-04-23 DIAGNOSIS — H35033 Hypertensive retinopathy, bilateral: Secondary | ICD-10-CM | POA: Diagnosis not present

## 2023-04-23 DIAGNOSIS — I10 Essential (primary) hypertension: Secondary | ICD-10-CM

## 2023-05-25 ENCOUNTER — Other Ambulatory Visit: Payer: Self-pay | Admitting: Internal Medicine

## 2023-05-25 ENCOUNTER — Other Ambulatory Visit: Payer: Self-pay

## 2023-07-03 ENCOUNTER — Encounter: Payer: Self-pay | Admitting: Internal Medicine

## 2023-07-03 ENCOUNTER — Ambulatory Visit: Payer: BC Managed Care – PPO | Admitting: Internal Medicine

## 2023-07-03 VITALS — BP 142/90 | HR 83 | Temp 97.8°F | Ht 68.0 in | Wt 234.0 lb

## 2023-07-03 DIAGNOSIS — L0231 Cutaneous abscess of buttock: Secondary | ICD-10-CM | POA: Diagnosis not present

## 2023-07-03 DIAGNOSIS — N1831 Chronic kidney disease, stage 3a: Secondary | ICD-10-CM | POA: Diagnosis not present

## 2023-07-03 DIAGNOSIS — E559 Vitamin D deficiency, unspecified: Secondary | ICD-10-CM

## 2023-07-03 DIAGNOSIS — I10 Essential (primary) hypertension: Secondary | ICD-10-CM | POA: Diagnosis not present

## 2023-07-03 MED ORDER — DOXYCYCLINE HYCLATE 100 MG PO TABS: 100.0000 mg | ORAL_TABLET | Freq: Two times a day (BID) | ORAL | 0 refills | Status: DC

## 2023-07-03 MED ORDER — TRAMADOL HCL 50 MG PO TABS: 50.0000 mg | ORAL_TABLET | Freq: Four times a day (QID) | ORAL | 0 refills | Status: DC | PRN

## 2023-07-03 NOTE — Assessment & Plan Note (Signed)
 Mod sized, for antibx course doxycycline 100 bid, tramadol prn, refer general surgury will likely need I & D,  to f/u any worsening symptoms or concerns

## 2023-07-03 NOTE — Assessment & Plan Note (Signed)
 BP Readings from Last 3 Encounters:  07/03/23 (!) 142/90  10/09/22 122/78  10/26/21 136/82   Uncontrolled, likely reactive, pt to continue medical treatment norvasc 10 every day, lisinopril 40 qd

## 2023-07-03 NOTE — Assessment & Plan Note (Signed)
 Lab Results  Component Value Date   CREATININE 1.50 10/09/2022   Stable overall, cont to avoid nephrotoxins

## 2023-07-03 NOTE — Progress Notes (Signed)
 Patient ID: Chad Murphy, male   DOB: 03-Jul-1960, 63 y.o.   MRN: [id number]        Chief Complaint: follow up right buttock abscess       HPI:  Chad Murphy is a 63 y.o. male here with c/o 3 days onset right buttock pain swelling redness and drainage near the natal cleft.  No fever, chills.  Pt denies chest pain, increased sob or doe, wheezing, orthopnea, PND, increased LE swelling, palpitations, dizziness or syncope.   Pt denies polydipsia, polyuria, or new focal neuro s/s.        Wt Readings from Last 3 Encounters:  07/03/23 234 lb (106.1 kg)  10/09/22 234 lb (106.1 kg)  10/26/21 240 lb (108.9 kg)   BP Readings from Last 3 Encounters:  07/03/23 (!) 142/90  10/09/22 122/78  10/26/21 136/82         Past Medical History:  Diagnosis Date   History of low back pain    Hyperlipidemia    Hypertension, essential    Scrotal lesion    Past Surgical History:  Procedure Laterality Date   EXCISION EPIDERMOID CYST, ABDOMINAL WALL  05/19/2003   KNEE ARTHROSCOPY Left 05/08/1998   SCROTAL EXPLORATION N/A 12/17/2012   Procedure: EXCISION OF SCROTAL LESION;  Surgeon: Martina Sinner, MD;  Location: [facility];  Service: Urology;  Laterality: N/A;   TOTAL HIP ARTHROPLASTY Left    WRIST GANGLION EXCISION Left 05/08/2000    reports that he quit smoking about 30 years ago. His smoking use included cigarettes. He started smoking about 36 years ago. He has a 3 pack-year smoking history. He has never used smokeless tobacco. He reports current alcohol use. He reports that he does not use drugs. family history includes Allergies in an other family member; Arthritis in an other family member; Cancer in his sister; Diabetes in an other family member; Hypertension in an other family member. No Known Allergies Current Outpatient Medications on File Prior to Visit  Medication Sig Dispense Refill   amLODipine (NORVASC) 10 MG tablet TAKE 1 TABLET BY MOUTH EVERY DAY 90 tablet 1    aspirin EC 81 MG tablet Take 1 tablet (81 mg total) by mouth daily. 90 tablet 11   atorvastatin (LIPITOR) 40 MG tablet TAKE 1 TABLET BY MOUTH EVERY DAY 90 tablet 3   Cholecalciferol (THERA-D 2000) 50 MCG (2000 UT) TABS 1 tab by mouth once daily 30 tablet 99   lisinopril (ZESTRIL) 40 MG tablet TAKE 1 TABLET BY MOUTH EVERY DAY IN THE EVENING 90 tablet 3   Multiple Vitamin (MULTIVITAMIN) tablet Take 1 tablet by mouth daily.     naproxen (NAPROSYN) 500 MG tablet TAKE 1 TABLET BY MOUTH TWICE DAILY WITH A MEAL AS NEEDED FOR PAIN 60 tablet 3   tadalafil (CIALIS) 20 MG tablet TAKE 1 TABLET BY MOUTH DAILY AS NEEDED FOR ERECTILE DYSFUNCTION 10 tablet 11   triamcinolone (NASACORT AQ) 55 MCG/ACT AERO nasal inhaler Place 2 sprays into the nose daily. 1 each 12   predniSONE (DELTASONE) 10 MG tablet 3 tabs by mouth per day for 3 days,2tabs per day for 3 days,1tab per day for 3 days 18 tablet 0   No current facility-administered medications on file prior to visit.        ROS:  All others reviewed and negative.  Objective        PE:  BP (!) 142/90   Pulse 83   Temp 97.8 F (36.6 C)  Ht 5\' 8"  (1.727 m)   Wt 234 lb (106.1 kg)   SpO2 97%   BMI 35.58 kg/m                 Constitutional: Pt appears in NAD               HENT: Head: NCAT.                Right Ear: External ear normal.                 Left Ear: External ear normal.                Eyes: . Pupils are equal, round, and reactive to light. Conjunctivae and EOM are normal               Nose: without d/c or deformity               Neck: Neck supple. Gross normal ROM               Cardiovascular: Normal rate and regular rhythm.                 Pulmonary/Chest: Effort normal and breath sounds without rales or wheezing.                Abd:  Soft, NT, ND, + BS, no organomegaly               Neurological: Pt is alert. At baseline orientation, motor grossly intact               Skin: Skin is warm.  LE edema - none; right buttock near natal cleft  is 2-3 cm area moderate sized abscess without active drainage but fluctuant               Psychiatric: Pt behavior is normal without agitation   Micro: none  Cardiac tracings I have personally interpreted today:  none  Pertinent Radiological findings (summarize): none   Lab Results  Component Value Date   WBC 5.5 10/09/2022   HGB 13.6 10/09/2022   HCT 40.8 10/09/2022   PLT 178.0 10/09/2022   GLUCOSE 107 (H) 10/09/2022   CHOL 132 10/09/2022   TRIG 103.0 10/09/2022   HDL 45.10 10/09/2022   LDLDIRECT 144.5 11/28/2007   LDLCALC 66 10/09/2022   ALT 28 10/09/2022   AST 21 10/09/2022   NA 140 10/09/2022   K 3.7 10/09/2022   CL 105 10/09/2022   CREATININE 1.50 10/09/2022   BUN 19 10/09/2022   CO2 27 10/09/2022   TSH 0.54 10/09/2022   PSA 2.84 10/09/2022   HGBA1C 5.8 10/09/2022   Assessment/Plan:  Chad Murphy is a 63 y.o. Black or African American [2] male with  has a past medical history of History of low back pain, Hyperlipidemia, Hypertension, essential, and Scrotal lesion.  Abscess of buttock, right  Mod sized, for antibx course doxycycline 100 bid, tramadol prn, refer general surgury will likely need I & D,  to f/u any worsening symptoms or concerns  Vitamin D deficiency Last vitamin D Lab Results  Component Value Date   VD25OH 35.48 10/09/2022   Low, to start oral replacement   Hypertension BP Readings from Last 3 Encounters:  07/03/23 (!) 142/90  10/09/22 122/78  10/26/21 136/82   Uncontrolled, likely reactive, pt to continue medical treatment norvasc 10 every day, lisinopril 40 qd   CKD (chronic kidney disease) Lab Results  Component Value  Date   CREATININE 1.50 10/09/2022   Stable overall, cont to avoid nephrotoxins  Followup: Return if symptoms worsen or fail to improve.  Oliver Barre, MD 07/03/2023 9:20 PM [facility] [place] [facility] - [facility]

## 2023-07-03 NOTE — Patient Instructions (Signed)
 Please take all new medication as prescribed - the antibiotic, and pain medicine as needed  Please continue all other medications as before, and refills have been done if requested.  Please have the pharmacy call with any other refills you may need.  Please keep your appointments with your specialists as you may have planned  You will be contacted regarding the referral for: General Surgury - urgent for right buttock abscess

## 2023-07-03 NOTE — Assessment & Plan Note (Signed)
 Last vitamin D Lab Results  Component Value Date   VD25OH 35.48 10/09/2022   Low, to start oral replacement

## 2023-07-16 ENCOUNTER — Telehealth: Payer: Self-pay

## 2023-07-16 NOTE — Telephone Encounter (Signed)
 Ok I forward to [facility]

## 2023-07-16 NOTE — Telephone Encounter (Unsigned)
 Copied from CRM [id number]. Topic: Referral - Status >> Jul 16, 2023  9:00 AM Truddie Crumble wrote: Reason for CRM: patient called stating he has not received a call yet from [facility] [place] [facility]

## 2023-07-23 ENCOUNTER — Ambulatory Visit: Payer: Self-pay | Admitting: Surgery

## 2023-08-02 ENCOUNTER — Other Ambulatory Visit: Payer: Self-pay

## 2023-08-02 ENCOUNTER — Encounter (HOSPITAL_BASED_OUTPATIENT_CLINIC_OR_DEPARTMENT_OTHER): Payer: Self-pay | Admitting: Surgery

## 2023-08-07 MED ORDER — CHLORHEXIDINE GLUCONATE CLOTH 2 % EX PADS: 6.0000 | MEDICATED_PAD | Freq: Once | CUTANEOUS | Status: DC

## 2023-08-07 NOTE — Progress Notes (Signed)

## 2023-08-09 ENCOUNTER — Other Ambulatory Visit: Payer: Self-pay

## 2023-08-09 ENCOUNTER — Ambulatory Visit (HOSPITAL_BASED_OUTPATIENT_CLINIC_OR_DEPARTMENT_OTHER): Admitting: Anesthesiology

## 2023-08-09 ENCOUNTER — Encounter (HOSPITAL_BASED_OUTPATIENT_CLINIC_OR_DEPARTMENT_OTHER): Payer: Self-pay | Admitting: Surgery

## 2023-08-09 ENCOUNTER — Encounter (HOSPITAL_BASED_OUTPATIENT_CLINIC_OR_DEPARTMENT_OTHER)
Admission: RE | Disposition: A | Discharge status: Home / Self Care | Payer: Self-pay | Source: Home / Self Care | Attending: Surgery

## 2023-08-09 ENCOUNTER — Ambulatory Visit (HOSPITAL_BASED_OUTPATIENT_CLINIC_OR_DEPARTMENT_OTHER)
Admission: RE | Admit: 2023-08-09 | Discharge: 2023-08-09 | Disposition: A | Discharge status: Home / Self Care | Attending: Surgery | Admitting: Surgery

## 2023-08-09 DIAGNOSIS — Z79899 Other long term (current) drug therapy: Secondary | ICD-10-CM | POA: Insufficient documentation

## 2023-08-09 DIAGNOSIS — L720 Epidermal cyst: Secondary | ICD-10-CM | POA: Diagnosis not present

## 2023-08-09 DIAGNOSIS — L723 Sebaceous cyst: Secondary | ICD-10-CM | POA: Diagnosis present

## 2023-08-09 DIAGNOSIS — Z87891 Personal history of nicotine dependence: Secondary | ICD-10-CM | POA: Insufficient documentation

## 2023-08-09 DIAGNOSIS — I10 Essential (primary) hypertension: Secondary | ICD-10-CM | POA: Diagnosis not present

## 2023-08-09 HISTORY — PX: LESION REMOVAL: SHX5196

## 2023-08-09 SURGERY — EXCISION, LESION, TORSO
Anesthesia: General | Laterality: Right

## 2023-08-09 MED ORDER — FENTANYL CITRATE (PF) 100 MCG/2ML IJ SOLN
INTRAMUSCULAR | Status: DC | PRN
  Administered 2023-08-09: 50 ug via INTRAVENOUS

## 2023-08-09 MED ORDER — FENTANYL CITRATE (PF) 100 MCG/2ML IJ SOLN: 25.0000 ug | INTRAMUSCULAR | Status: DC | PRN

## 2023-08-09 MED ORDER — ROCURONIUM 10MG/ML (10ML) SYRINGE FOR MEDFUSION PUMP - OPTIME
INTRAVENOUS | Status: DC | PRN
  Administered 2023-08-09: 50 mg via INTRAVENOUS

## 2023-08-09 MED ORDER — OXYCODONE HCL 5 MG PO TABS: 5.0000 mg | ORAL_TABLET | Freq: Once | ORAL | Status: DC | PRN

## 2023-08-09 MED ORDER — DEXAMETHASONE SODIUM PHOSPHATE 10 MG/ML IJ SOLN
INTRAMUSCULAR | Status: AC
  Filled 2023-08-09: qty 1

## 2023-08-09 MED ORDER — CELECOXIB 200 MG PO CAPS
ORAL_CAPSULE | ORAL | Status: AC
  Filled 2023-08-09: qty 1

## 2023-08-09 MED ORDER — GABAPENTIN 300 MG PO CAPS
ORAL_CAPSULE | ORAL | Status: AC
  Filled 2023-08-09: qty 1

## 2023-08-09 MED ORDER — DROPERIDOL 2.5 MG/ML IJ SOLN: 0.6250 mg | Freq: Once | INTRAMUSCULAR | Status: DC | PRN

## 2023-08-09 MED ORDER — ACETAMINOPHEN 500 MG PO TABS
ORAL_TABLET | ORAL | Status: AC
  Filled 2023-08-09: qty 2

## 2023-08-09 MED ORDER — ROCURONIUM BROMIDE 10 MG/ML (PF) SYRINGE
PREFILLED_SYRINGE | INTRAVENOUS | Status: AC
  Filled 2023-08-09: qty 10

## 2023-08-09 MED ORDER — MIDAZOLAM HCL 2 MG/2ML IJ SOLN
INTRAMUSCULAR | Status: AC
  Filled 2023-08-09: qty 2

## 2023-08-09 MED ORDER — ONDANSETRON HCL 4 MG/2ML IJ SOLN
INTRAMUSCULAR | Status: DC | PRN
  Administered 2023-08-09: 4 mg via INTRAVENOUS

## 2023-08-09 MED ORDER — CEFAZOLIN SODIUM-DEXTROSE 2-4 GM/100ML-% IV SOLN
2.0000 g | INTRAVENOUS | Status: AC
  Administered 2023-08-09: 2 g via INTRAVENOUS

## 2023-08-09 MED ORDER — BUPIVACAINE-EPINEPHRINE 0.25% -1:200000 IJ SOLN
INTRAMUSCULAR | Status: DC | PRN
  Administered 2023-08-09: 16 mL

## 2023-08-09 MED ORDER — PHENYLEPHRINE 80 MCG/ML (10ML) SYRINGE FOR IV PUSH (FOR BLOOD PRESSURE SUPPORT)
PREFILLED_SYRINGE | INTRAVENOUS | Status: AC
  Filled 2023-08-09: qty 10

## 2023-08-09 MED ORDER — DEXAMETHASONE SODIUM PHOSPHATE 10 MG/ML IJ SOLN
INTRAMUSCULAR | Status: DC | PRN
  Administered 2023-08-09: 8 mg via INTRAVENOUS

## 2023-08-09 MED ORDER — ACETAMINOPHEN 500 MG PO TABS
1000.0000 mg | ORAL_TABLET | ORAL | Status: AC
  Administered 2023-08-09: 1000 mg via ORAL

## 2023-08-09 MED ORDER — OXYCODONE HCL 5 MG/5ML PO SOLN: 5.0000 mg | Freq: Once | ORAL | Status: DC | PRN

## 2023-08-09 MED ORDER — FENTANYL CITRATE (PF) 100 MCG/2ML IJ SOLN
INTRAMUSCULAR | Status: AC
  Filled 2023-08-09: qty 2

## 2023-08-09 MED ORDER — OXYCODONE HCL 5 MG PO TABS
5.0000 mg | ORAL_TABLET | Freq: Four times a day (QID) | ORAL | 0 refills | Status: AC | PRN
Start: 2023-08-09 — End: ?

## 2023-08-09 MED ORDER — LIDOCAINE 2% (20 MG/ML) 5 ML SYRINGE
INTRAMUSCULAR | Status: AC
  Filled 2023-08-09: qty 5

## 2023-08-09 MED ORDER — BUPIVACAINE-EPINEPHRINE (PF) 0.25% -1:200000 IJ SOLN
INTRAMUSCULAR | Status: AC
  Filled 2023-08-09: qty 30

## 2023-08-09 MED ORDER — MIDAZOLAM HCL 5 MG/5ML IJ SOLN
INTRAMUSCULAR | Status: DC | PRN
  Administered 2023-08-09: 2 mg via INTRAVENOUS

## 2023-08-09 MED ORDER — GABAPENTIN 300 MG PO CAPS
300.0000 mg | ORAL_CAPSULE | ORAL | Status: AC
  Administered 2023-08-09: 300 mg via ORAL

## 2023-08-09 MED ORDER — ONDANSETRON HCL 4 MG/2ML IJ SOLN
INTRAMUSCULAR | Status: AC
  Filled 2023-08-09: qty 2

## 2023-08-09 MED ORDER — CEFAZOLIN SODIUM-DEXTROSE 2-4 GM/100ML-% IV SOLN
INTRAVENOUS | Status: AC
  Filled 2023-08-09: qty 100

## 2023-08-09 MED ORDER — CELECOXIB 200 MG PO CAPS
200.0000 mg | ORAL_CAPSULE | ORAL | Status: AC
  Administered 2023-08-09: 200 mg via ORAL

## 2023-08-09 MED ORDER — SUGAMMADEX SODIUM 200 MG/2ML IV SOLN
INTRAVENOUS | Status: DC | PRN
  Administered 2023-08-09: 400 mg via INTRAVENOUS

## 2023-08-09 MED ORDER — LIDOCAINE 2% (20 MG/ML) 5 ML SYRINGE
INTRAMUSCULAR | Status: DC | PRN
  Administered 2023-08-09: 100 mg via INTRAVENOUS

## 2023-08-09 MED ORDER — PROPOFOL 10 MG/ML IV BOLUS
INTRAVENOUS | Status: DC | PRN
  Administered 2023-08-09: 160 ug via INTRAVENOUS

## 2023-08-09 MED ORDER — LACTATED RINGERS IV SOLN: INTRAVENOUS | Status: DC

## 2023-08-09 SURGICAL SUPPLY — 38 items
BENZOIN TINCTURE PRP APPL 2/3 (GAUZE/BANDAGES/DRESSINGS) IMPLANT
BLADE SURG 10 STRL SS (BLADE) ×1 IMPLANT
BLADE SURG 15 STRL LF DISP TIS (BLADE) ×1 IMPLANT
CANISTER SUCT 1200ML W/VALVE (MISCELLANEOUS) IMPLANT
CHLORAPREP W/TINT 26 (MISCELLANEOUS) ×1 IMPLANT
COVER BACK TABLE 60X90IN (DRAPES) ×1 IMPLANT
COVER MAYO STAND STRL (DRAPES) ×1 IMPLANT
DRAPE LAPAROTOMY 100X72 PEDS (DRAPES) ×1 IMPLANT
DRAPE UTILITY XL STRL (DRAPES) ×1 IMPLANT
ELECT COATED BLADE 2.86 ST (ELECTRODE) ×1 IMPLANT
ELECT REM PT RETURN 9FT ADLT (ELECTROSURGICAL) ×1 IMPLANT
ELECTRODE REM PT RTRN 9FT ADLT (ELECTROSURGICAL) ×1 IMPLANT
GLOVE BIOGEL PI IND STRL 8 (GLOVE) ×1 IMPLANT
GLOVE ECLIPSE 8.0 STRL XLNG CF (GLOVE) ×1 IMPLANT
GOWN STRL REUS W/ TWL LRG LVL3 (GOWN DISPOSABLE) ×2 IMPLANT
GOWN STRL REUS W/ TWL XL LVL3 (GOWN DISPOSABLE) ×1 IMPLANT
NDL HYPO 25X1 1.5 SAFETY (NEEDLE) ×1 IMPLANT
NEEDLE HYPO 25X1 1.5 SAFETY (NEEDLE) ×1 IMPLANT
NS IRRIG 1000ML POUR BTL (IV SOLUTION) IMPLANT
PACK BASIN DAY SURGERY FS (CUSTOM PROCEDURE TRAY) ×1 IMPLANT
PENCIL SMOKE EVACUATOR (MISCELLANEOUS) ×1 IMPLANT
SLEEVE SCD COMPRESS KNEE MED (STOCKING) ×1 IMPLANT
SPIKE FLUID TRANSFER (MISCELLANEOUS) IMPLANT
SPONGE T-LAP 4X18 ~~LOC~~+RFID (SPONGE) ×1 IMPLANT
STRIP CLOSURE SKIN 1/2X4 (GAUZE/BANDAGES/DRESSINGS) IMPLANT
SUT MNCRL AB 3-0 PS2 18 (SUTURE) IMPLANT
SUT MNCRL AB 4-0 PS2 18 (SUTURE) IMPLANT
SUT MON AB 4-0 PC3 18 (SUTURE) ×1 IMPLANT
SUT VIC AB 2-0 SH 27XBRD (SUTURE) IMPLANT
SUT VICRYL 3-0 CR8 SH (SUTURE) ×1 IMPLANT
SUT VICRYL AB 3 0 TIES (SUTURE) IMPLANT
SYR BULB EAR ULCER 3OZ GRN STR (SYRINGE) ×1 IMPLANT
SYR CONTROL 10ML LL (SYRINGE) ×1 IMPLANT
TOWEL GREEN STERILE FF (TOWEL DISPOSABLE) ×2 IMPLANT
TRAY DSU PREP LF (CUSTOM PROCEDURE TRAY) IMPLANT
TUBE CONNECTING 20X1/4 (TUBING) IMPLANT
UNDERPAD 30X36 HEAVY ABSORB (UNDERPADS AND DIAPERS) IMPLANT
YANKAUER SUCT BULB TIP NO VENT (SUCTIONS) IMPLANT

## 2023-08-09 NOTE — Interval H&P Note (Signed)
 History and Physical Interval Note:  08/09/2023 10:13 AM  Chad Murphy  has presented today for surgery, with the diagnosis of SEBACEOUS CYST LEFT BUTTOCKS.  The various methods of treatment have been discussed with the patient and family. After consideration of risks, benefits and other options for treatment, the patient has consented to  Procedure(s) with comments: EXCISION, LESION, TORSO (Right) - sebaceous cyst right buttocks as a surgical intervention.  The patient's history has been reviewed, patient examined, no change in status, stable for surgery.  I have reviewed the patient's chart and labs.  Questions were answered to the patient's satisfaction.   The procedure has been discussed with the patient.  Alternative therapies have been discussed with the patient.  Operative risks include bleeding,  Infection,  Organ injury,  Nerve injury,  Blood vessel injury,  DVT,  Pulmonary embolism,  Death,  And possible reoperation.  Medical management risks include worsening of present situation.  The success of the procedure is 50 -90 % at treating patients symptoms.  The patient understands and agrees to proceed.   Malie Kashani A Charmelle Soh

## 2023-08-09 NOTE — H&P (Signed)
 History of Present Illness: Chad Murphy is a 64 y.o. male who is seen today as an office consultation for evaluation of Pilonidal Cyst  Patient presents for evaluation of a right cyst on his right medial buttock. This arose over the last month or so. Had been red and draining but now it is cleared up. The area though persist despite drainage and antibiotics. Today, he has no pain or drainage. Denies fever or chills.  Review of Systems: A complete review of systems was obtained from the patient. I have reviewed this information and discussed as appropriate with the patient. See HPI as well for other ROS.    Medical History: Past Medical History:  Diagnosis Date  Arthritis  Hypertension   There is no problem list on file for this patient.  Past Surgical History:  Procedure Laterality Date  left hip replacement  right foot surgery    No Known Allergies  Current Outpatient Medications on File Prior to Visit  Medication Sig Dispense Refill  amLODIPine (NORVASC) 10 MG tablet Take 1 tablet by mouth once daily  aspirin 81 MG chewable tablet Take 81 mg by mouth once daily  atorvastatin (LIPITOR) 40 MG tablet Take 1 tablet by mouth once daily  lisinopriL (ZESTRIL) 40 MG tablet Take 1 tablet by mouth every evening   No current facility-administered medications on file prior to visit.   Family History  Problem Relation Age of Onset  High blood pressure (Hypertension) Mother  Diabetes Mother  Diabetes Brother    Social History   Tobacco Use  Smoking Status Former  Types: Cigarettes  Smokeless Tobacco Never    Social History   Socioeconomic History  Marital status: Single  Tobacco Use  Smoking status: Former  Types: Cigarettes  Smokeless tobacco: Never   Social Drivers of Corporate investment banker Strain: Patient Declined (07/02/2023)  Received from [facility]  Overall Financial Resource Strain (CARDIA)  Difficulty of Paying Living Expenses: Patient  declined  Food Insecurity: Patient Declined (07/02/2023)  Received from [facility]  Hunger Vital Sign  Worried About Running Out of Food in the Last Year: Patient declined  Ran Out of Food in the Last Year: Patient declined  Transportation Needs: Unknown (07/02/2023)  Received from [facility] - [facility]  Lack of Transportation (Medical): No  Lack of Transportation (Non-Medical): Patient declined  Physical Activity: Insufficiently Active (07/02/2023)  Received from [facility]  Exercise Vital Sign  Days of Exercise per Week: 1 day  Minutes of Exercise per Session: 10 min  Stress: No Stress Concern Present (07/02/2023)  Received from [facility] - Occupational Stress Questionnaire  Feeling of Stress : Not at all  Social Connections: Unknown (07/02/2023)  Received from [facility]  Social Connection and Isolation Panel [NHANES]  Frequency of Communication with Friends and Family: Once a week  Frequency of Social Gatherings with Friends and Family: Once a week  Attends Religious Services: Never  Database administrator or Organizations: No  Marital Status: Patient declined  Housing Stability: Unknown (07/23/2023)  Housing Stability Vital Sign  Homeless in the Last Year: No   Objective:   Vitals:  07/23/23 1435  BP: (!) 146/82  Pulse: 106  Weight: (!) 105.1 kg (231 lb 12.8 oz)  Height: 172.7 cm (5\' 8" )  PainSc: 0-No pain   Body mass index is 35.25 kg/m.  Physical Exam Vitals reviewed.  HENT:  Head: Normocephalic.  Cardiovascular:  Rate and Rhythm:  Normal rate.  Pulmonary:  Effort: Pulmonary effort is normal.  Skin:   Comments: 4 cm sebaceous cyst No cellulitis or fluctuance Right mid buttock  No erythema or tenderness. No drainage.  Neurological:  General: No focal deficit present.  Mental Status: He is alert.  Psychiatric:  Mood and Affect: Mood normal.     Assessment and Plan:   Diagnoses and all  orders for this visit:  Sebaceous cyst   Recommend excision of previously infected right buttock epidermal inclusion cyst measuring 4 cm extending into the subcutaneous fat. No signs of infection today. Risk of bleeding, infection, recurrence, anesthesia risk, and the need for further treatment and other procedures.   Hayden Rasmussen, MD

## 2023-08-09 NOTE — Transfer of Care (Signed)
 Immediate Anesthesia Transfer of Care Note  Patient: Chad Murphy  Procedure(s) Performed: Procedure(s) (LRB): EXCISION, LESION, TORSO (Right)  Patient Location: PACU  Anesthesia Type: General  Level of Consciousness: awake, oriented, sedated and patient cooperative  Airway & Oxygen Therapy: Patient Spontanous Breathing and Patient connected to face mask oxygen  Post-op Assessment: Report given to PACU RN and Post -op Vital signs reviewed and stable  Post vital signs: Reviewed and stable  Complications: No apparent anesthesia complications Last Vitals:  Vitals Value Taken Time  BP 124/83 08/09/23 1132  Temp    Pulse 63 08/09/23 1133  Resp 11 08/09/23 1133  SpO2 99 % 08/09/23 1133  Vitals shown include unfiled device data.  Last Pain:  Vitals:   08/09/23 0943  TempSrc: Temporal  PainSc: 0-No pain         Complications: No notable events documented.

## 2023-08-09 NOTE — Op Note (Signed)
 Preoperative diagnosis: Right buttock cyst measuring 5 cm x 3 cm  Postoperative diagnosis: Same  Procedure: Excision of right buttock cyst  Surgeon: Harriette Bouillon, MD  Anesthesia: General With 0.25% Marcaine with epinephrine  EBL: Minimal  Specimen: Right buttock sebaceous cyst to pathology/remnants of cyst to pathology  Indications for procedure: The patient is a 62 year old male with a right buttock cyst.  He is multiple infections and is appears to be a chronically infected right buttock sebaceous cyst.  He presents for excision due to recurrent infections.The procedure has been discussed with the patient.  Alternative therapies have been discussed with the patient.  Operative risks include bleeding,  Infection,  Organ injury,  Nerve injury,  Blood vessel injury,  DVT,  Pulmonary embolism,  Death,  And possible reoperation.  Medical management risks include worsening of present situation.  The success of the procedure is 50 -90 % at treating patients symptoms.  The patient understands and agrees to proceed.    Description of procedure: The patient was met in the holding area and questions were answered.  He was then taken back to the operative room and placed initially supine on his bed.  Once he was intubated under general esthesia he was placed prone and padded appropriately.  The right buttock cyst was easily seen.  Both buttocks were then prepped and draped in sterile fashion and timeout performed.  Elliptical incision was made above and around the cyst.  Dissection was carried down and the entire cyst remnant was excised with grossly negative margins.  This was taken down into the subcutaneous fat.  Local anesthetic was infiltrated.  The deep tissue planes were approximated with 3-0 Vicryl.  4 Monocryl was used to close the skin.  Dermabond applied.  All counts found to be correct.  The patient was awoke extubated taken to recovery after placing him supine in satisfactory condition.

## 2023-08-09 NOTE — Anesthesia Preprocedure Evaluation (Addendum)
 Anesthesia Evaluation  Patient identified by MRN, date of birth, ID band Patient awake    Reviewed: Allergy & Precautions, NPO status , Patient's Chart, lab work & pertinent test results  History of Anesthesia Complications Negative for: history of anesthetic complications  Airway Mallampati: II  TM Distance: >3 FB Neck ROM: Full    Dental  (+) Edentulous Upper   Pulmonary former smoker   Pulmonary exam normal        Cardiovascular hypertension, Pt. on medications Normal cardiovascular exam     Neuro/Psych negative neurological ROS     GI/Hepatic Neg liver ROS, PUD,,,  Endo/Other  negative endocrine ROS    Renal/GU Renal InsufficiencyRenal disease     Musculoskeletal  (+) Arthritis ,    Abdominal   Peds  Hematology negative hematology ROS (+)   Anesthesia Other Findings Day of surgery medications reviewed with patient.  Reproductive/Obstetrics                             Anesthesia Physical Anesthesia Plan  ASA: 2  Anesthesia Plan: General   Post-op Pain Management: Tylenol PO (pre-op)*   Induction: Intravenous  PONV Risk Score and Plan: 2 and Treatment may vary due to age or medical condition, Ondansetron, Dexamethasone and Midazolam  Airway Management Planned: Oral ETT  Additional Equipment: None  Intra-op Plan:   Post-operative Plan: Extubation in OR  Informed Consent: I have reviewed the patients History and Physical, chart, labs and discussed the procedure including the risks, benefits and alternatives for the proposed anesthesia with the patient or authorized representative who has indicated his/her understanding and acceptance.     Dental advisory given  Plan Discussed with: CRNA  Anesthesia Plan Comments:        Anesthesia Quick Evaluation

## 2023-08-09 NOTE — Anesthesia Postprocedure Evaluation (Signed)
 Anesthesia Post Note  Patient: Chad Murphy  Procedure(s) Performed: EXCISION, LESION, TORSO (Right)     Patient location during evaluation: PACU Anesthesia Type: General Level of consciousness: awake and alert Pain management: pain level controlled Vital Signs Assessment: post-procedure vital signs reviewed and stable Respiratory status: spontaneous breathing, nonlabored ventilation and respiratory function stable Cardiovascular status: blood pressure returned to baseline Postop Assessment: no apparent nausea or vomiting Anesthetic complications: no   No notable events documented.  Last Vitals:  Vitals:   08/09/23 1201 08/09/23 1203  BP: 114/89   Pulse: (!) 55 (!) 57  Resp: 12 20  Temp:    SpO2: 97% 97%    Last Pain:  Vitals:   08/09/23 1203  TempSrc:   PainSc: 0-No pain                 Shanda Howells

## 2023-08-09 NOTE — Anesthesia Procedure Notes (Signed)
 Procedure Name: Intubation Date/Time: 08/09/2023 10:42 AM  Performed by: Yolanda Bonine, CRNAPre-anesthesia Checklist: Patient identified, Emergency Drugs available, Suction available and Patient being monitored Patient Re-evaluated:Patient Re-evaluated prior to induction Oxygen Delivery Method: Circle system utilized Preoxygenation: Pre-oxygenation with 100% oxygen Induction Type: IV induction Ventilation: Mask ventilation without difficulty Laryngoscope Size: Mac and 3 Grade View: Grade I Tube type: Oral Tube size: 7.0 mm Number of attempts: 1 Airway Equipment and Method: Stylet and Oral airway Placement Confirmation: ETT inserted through vocal cords under direct vision, positive ETCO2 and breath sounds checked- equal and bilateral Secured at: 22 cm Tube secured with: Tape Dental Injury: Teeth and Oropharynx as per pre-operative assessment

## 2023-08-09 NOTE — Discharge Instructions (Addendum)
 PT HAD TYLENOL<CELEBREX<GAPAPENTIN at 09:45. No similar meds  until after 3:45pm today    Post Anesthesia Home Care Instructions  Activity: Get plenty of rest for the remainder of the day. A responsible individual must stay with you for 24 hours following the procedure.  For the next 24 hours, DO NOT: -Drive a car -Advertising copywriter -Drink alcoholic beverages -Take any medication unless instructed by your physician -Make any legal decisions or sign important papers.  Meals: Start with liquid foods such as gelatin or soup. Progress to regular foods as tolerated. Avoid greasy, spicy, heavy foods. If nausea and/or vomiting occur, drink only clear liquids until the nausea and/or vomiting subsides. Call your physician if vomiting continues.  Special Instructions/Symptoms: Your throat may feel dry or sore from the anesthesia or the breathing tube placed in your throat during surgery. If this causes discomfort, gargle with warm salt water. The discomfort should disappear within 24 hours.  If you had a scopolamine patch placed behind your ear for the management of post- operative nausea and/or vomiting:  1. The medication in the patch is effective for 72 hours, after which it should be removed.  Wrap patch in a tissue and discard in the trash. Wash hands thoroughly with soap and water. 2. You may remove the patch earlier than 72 hours if you experience unpleasant side effects which may include dry mouth, dizziness or visual disturbances. 3. Avoid touching the patch. Wash your hands with soap and water after contact with the patch.      Post Anesthesia Home Care Instructions  Activity: Get plenty of rest for the remainder of the day. A responsible individual must stay with you for 24 hours following the procedure.  For the next 24 hours, DO NOT: -Drive a car -Advertising copywriter -Drink alcoholic beverages -Take any medication unless instructed by your physician -Make any legal decisions  or sign important papers.  Meals: Start with liquid foods such as gelatin or soup. Progress to regular foods as tolerated. Avoid greasy, spicy, heavy foods. If nausea and/or vomiting occur, drink only clear liquids until the nausea and/or vomiting subsides. Call your physician if vomiting continues.  Special Instructions/Symptoms: Your throat may feel dry or sore from the anesthesia or the breathing tube placed in your throat during surgery. If this causes discomfort, gargle with warm salt water. The discomfort should disappear within 24 hours.  If you had a scopolamine patch placed behind your ear for the management of post- operative nausea and/or vomiting:  1. The medication in the patch is effective for 72 hours, after which it should be removed.  Wrap patch in a tissue and discard in the trash. Wash hands thoroughly with soap and water. 2. You may remove the patch earlier than 72 hours if you experience unpleasant side effects which may include dry mouth, dizziness or visual disturbances. 3. Avoid touching the patch. Wash your hands with soap and water after contact with the patch.        #######################################################  GENERAL SURGERY: POST OP INSTRUCTIONS  ######################################################################  EAT Gradually transition to a high fiber diet with a fiber supplement over the next few weeks after discharge.  Start with a pureed / full liquid diet (see below)  WALK Walk an hour a day.  Control your pain to do that.    CONTROL PAIN Control pain so that you can walk, sleep, tolerate sneezing/coughing, go up/down stairs.  HAVE A BOWEL MOVEMENT DAILY Keep your bowels regular to avoid problems.  OK  to try a laxative to override constipation.  OK to use an antidairrheal to slow down diarrhea.  Call if not better after 2 tries  CALL IF YOU HAVE PROBLEMS/CONCERNS Call if you are still struggling despite following these  instructions. Call if you have concerns not answered by these instructions  ######################################################################    DIET: Follow a light bland diet & liquids the first 24 hours after arrival home, such as soup, liquids, starches, etc.  Be sure to drink plenty of fluids.  Quickly advance to a usual solid diet within a few days.  Avoid fast food or heavy meals as your are more likely to get nauseated or have irregular bowels.  A low-fat, high-fiber diet for the rest of your life is ideal.    Take your usually prescribed home medications unless otherwise directed. Blood thinners:  You can restart any strong blood thinners after the second postoperative day  for example: COUMADIN (warfarin), XERELTO (rivaroxaban), ELIQUIS (apixaban), PLAVIX (clopidigrel), BRILINTA (ticagrelor), EFFIENT (prasugrel), PRADAXA (dabigatran), etc  Continue aspirin before & after surgery..     Some oozing/bleeding the first 1-2 weeks is common but should taper down & be small volume.    If you are passing many large clots or having uncontrolling bleeding, call your surgeon  PAIN CONTROL: Pain is best controlled by a usual combination of three different methods TOGETHER: Ice/Heat Over the counter pain medication Prescription pain medication Most patients will experience some swelling and bruising around the incisions.  Ice packs or heating pads (30-60 minutes up to 6 times a day) will help. Use ice for the first few days to help decrease swelling and bruising, then switch to heat to help relax tight/sore spots and speed recovery.  Some people prefer to use ice alone, heat alone, alternating between ice & heat.  Experiment to what works for you.  Swelling and bruising can take several weeks to resolve.   It is helpful to take an over-the-counter pain medication regularly for the first few weeks.  Choose one of the following that works best for you: Naproxen (Aleve, etc)  Two 220mg  tabs  twice a day Ibuprofen (Advil, etc) Three 200mg  tabs four times a day (every meal & bedtime) Acetaminophen (Tylenol, etc) 500-650mg  four times a day (every meal & bedtime) A  prescription for pain medication (such as oxycodone, hydrocodone, etc) should be given to you upon discharge.  Take your pain medication as prescribed.  If you are having problems/concerns with the prescription medicine (does not control pain, nausea, vomiting, rash, itching, etc), please call us [phone] to see if we need to switch you to a different pain medicine that will work better for you and/or control your side effect better. If you need a refill on your pain medication, please contact your pharmacy.  They will contact our office to request authorization. Prescriptions will not be filled after 5 pm or on week-ends.  Avoid getting constipated.  Between the surgery and the pain medications, it is common to experience some constipation.  Increasing fluid intake and taking a fiber supplement (such as Metamucil, Citrucel, FiberCon, MiraLax, etc) 1-2 times a day regularly will usually help prevent this problem from occurring.  A mild laxative (prune juice, Milk of Magnesia, MiraLax, etc) should be taken according to package directions if there are no bowel movements after 48 hours.   Watch out for diarrhea.  If you have many loose bowel movements, simplify your diet to bland foods & liquids for a few  days.  Stop any stool softeners and decrease your fiber supplement.  Switching to mild anti-diarrheal medications (Loperamide/Imodium, Kayopectate, Pepto Bismol) can help.  If this worsens or does not improve, please call us.  Wash / shower every day.  You may shower over the dressings as they are waterproof.  Continue to shower over incision(s) after the dressing is off. Remove your waterproof bandages 5 days after surgery.  You may leave the incision open to air.  You may have skin tapes (Steri Strips) covering the incision(s).   Leave them on until one week, then remove.  You may replace a dressing/Band-Aid to cover the incision for comfort if you wish.   ACTIVITIES as tolerated:   You may resume regular (light) daily activities beginning the next day--such as daily self-care, walking, climbing stairs--gradually increasing activities as tolerated.  If you can walk 30 minutes without difficulty, it is safe to try more intense activity such as jogging, treadmill, bicycling, low-impact aerobics, swimming, etc. Save the most intensive and strenuous activity for last such as sit-ups, heavy lifting, contact sports, etc  Refrain from any heavy lifting or straining until you are off narcotics for pain control.   DO NOT PUSH THROUGH PAIN.  Let pain be your guide: If it hurts to do something, don't do it.  Pain is your body warning you to avoid that activity for another week until the pain goes down. You may drive when you are no longer taking prescription pain medication, you can comfortably wear a seatbelt, and you can safely maneuver your car and apply brakes. You may have sexual intercourse when it is comfortable.   FOLLOW UP in our office Please call [facility] at [phone] to set up an appointment to see your surgeon in the office for a follow-up appointment approximately 2-3 weeks after your surgery. Make sure that you call for this appointment the day you arrive home to insure a convenient appointment time.  9. IF YOU HAVE DISABILITY OR FAMILY LEAVE FORMS, BRING THEM TO THE OFFICE FOR PROCESSING.  DO NOT GIVE THEM TO YOUR DOCTOR.   WHEN TO CALL us [phone]: Poor pain control Reactions / problems with new medications (rash/itching, nausea, etc)  Fever over 101.5 F (38.5 C) Worsening swelling or bruising Continued bleeding from incision. Increased pain, redness, or drainage from the incision Difficulty breathing / swallowing   The clinic staff is available to answer your questions during regular business hours  (8:30am-5pm).  Please don't hesitate to call and ask to speak to one of our nurses for clinical concerns.   If you have a medical emergency, go to the nearest emergency room or call 911.  A surgeon from [facility] is always on call at the [facility], [place] [address], [address], [place], [place]  [address] ? MAIN: [phone] ? TOLL FREE: [phone] ?  FAX [phone] [email or web address]  #######################################################

## 2023-08-09 NOTE — Interval H&P Note (Signed)
 History and Physical Interval Note:  08/09/2023 10:28 AM  Chad Murphy  has presented today for surgery, with the diagnosis of SEBACEOUS CYST right  BUTTOCKS.  The various methods of treatment have been discussed with the patient and family. After consideration of risks, benefits and other options for treatment, the patient has consented to  Procedure(s) with comments: EXCISION, LESION, TORSO (Right) - sebaceous cyst right buttocks as a surgical intervention.  The patient's history has been reviewed, patient examined, no change in status, stable for surgery.  I have reviewed the patient's chart and labs.  Questions were answered to the patient's satisfaction.     Derek Laughter A Arrie Borrelli

## 2023-08-10 ENCOUNTER — Encounter (HOSPITAL_BASED_OUTPATIENT_CLINIC_OR_DEPARTMENT_OTHER): Payer: Self-pay | Admitting: Surgery

## 2023-08-10 LAB — SURGICAL PATHOLOGY

## 2023-10-12 ENCOUNTER — Other Ambulatory Visit

## 2023-10-12 ENCOUNTER — Ambulatory Visit: Payer: BC Managed Care – PPO | Admitting: Internal Medicine

## 2023-10-12 ENCOUNTER — Encounter: Payer: Self-pay | Admitting: Internal Medicine

## 2023-10-12 VITALS — BP 136/84 | HR 100 | Temp 98.3°F | Ht 68.0 in | Wt 234.0 lb

## 2023-10-12 DIAGNOSIS — Z0001 Encounter for general adult medical examination with abnormal findings: Secondary | ICD-10-CM

## 2023-10-12 DIAGNOSIS — Z125 Encounter for screening for malignant neoplasm of prostate: Secondary | ICD-10-CM

## 2023-10-12 DIAGNOSIS — Z0000 Encounter for general adult medical examination without abnormal findings: Secondary | ICD-10-CM

## 2023-10-12 DIAGNOSIS — E559 Vitamin D deficiency, unspecified: Secondary | ICD-10-CM

## 2023-10-12 DIAGNOSIS — I10 Essential (primary) hypertension: Secondary | ICD-10-CM

## 2023-10-12 DIAGNOSIS — E538 Deficiency of other specified B group vitamins: Secondary | ICD-10-CM | POA: Diagnosis not present

## 2023-10-12 DIAGNOSIS — K5730 Diverticulosis of large intestine without perforation or abscess without bleeding: Secondary | ICD-10-CM | POA: Insufficient documentation

## 2023-10-12 DIAGNOSIS — N1831 Chronic kidney disease, stage 3a: Secondary | ICD-10-CM

## 2023-10-12 DIAGNOSIS — R7302 Impaired glucose tolerance (oral): Secondary | ICD-10-CM

## 2023-10-12 LAB — CBC WITH DIFFERENTIAL/PLATELET
Basophils Absolute: 0.1 10*3/uL (ref 0.0–0.1)
Basophils Relative: 1.1 % (ref 0.0–3.0)
Eosinophils Absolute: 0.2 10*3/uL (ref 0.0–0.7)
Eosinophils Relative: 3 % (ref 0.0–5.0)
HCT: 42.3 % (ref 39.0–52.0)
Hemoglobin: 14.3 g/dL (ref 13.0–17.0)
Lymphocytes Relative: 34.7 % (ref 12.0–46.0)
Lymphs Abs: 2.2 10*3/uL (ref 0.7–4.0)
MCHC: 33.8 g/dL (ref 30.0–36.0)
MCV: 89.8 fl (ref 78.0–100.0)
Monocytes Absolute: 0.5 10*3/uL (ref 0.1–1.0)
Monocytes Relative: 8 % (ref 3.0–12.0)
Neutro Abs: 3.4 10*3/uL (ref 1.4–7.7)
Neutrophils Relative %: 53.2 % (ref 43.0–77.0)
Platelets: 212 10*3/uL (ref 150.0–400.0)
RBC: 4.71 Mil/uL (ref 4.22–5.81)
RDW: 14.8 % (ref 11.5–15.5)
WBC: 6.3 10*3/uL (ref 4.0–10.5)

## 2023-10-12 LAB — LIPID PANEL
Cholesterol: 143 mg/dL (ref 0–200)
HDL: 49 mg/dL (ref >39.00)
LDL Cholesterol: 74 mg/dL (ref 0–99)
NonHDL: 94.29
Total CHOL/HDL Ratio: 3
Triglycerides: 102 mg/dL (ref 0.0–149.0)
VLDL: 20.4 mg/dL (ref 0.0–40.0)

## 2023-10-12 LAB — HEMOGLOBIN A1C: Hgb A1c MFr Bld: 5.8 % (ref 4.6–6.5)

## 2023-10-12 LAB — BASIC METABOLIC PANEL WITH GFR
BUN: 19 mg/dL (ref 6–23)
CO2: 28 meq/L (ref 19–32)
Calcium: 9.2 mg/dL (ref 8.4–10.5)
Chloride: 107 meq/L (ref 96–112)
Creatinine, Ser: 1.37 mg/dL (ref 0.40–1.50)
GFR: 55.14 mL/min — ABNORMAL LOW (ref >60.00)
Glucose, Bld: 87 mg/dL (ref 70–99)
Potassium: 4.2 meq/L (ref 3.5–5.1)
Sodium: 141 meq/L (ref 135–145)

## 2023-10-12 LAB — HEPATIC FUNCTION PANEL
ALT: 26 U/L (ref 0–53)
AST: 20 U/L (ref 0–37)
Albumin: 4.3 g/dL (ref 3.5–5.2)
Alkaline Phosphatase: 84 U/L (ref 39–117)
Bilirubin, Direct: 0.1 mg/dL (ref 0.0–0.3)
Total Bilirubin: 0.4 mg/dL (ref 0.2–1.2)
Total Protein: 7 g/dL (ref 6.0–8.3)

## 2023-10-12 MED ORDER — TADALAFIL 20 MG PO TABS: ORAL_TABLET | ORAL | 11 refills | Status: AC

## 2023-10-12 MED ORDER — LISINOPRIL 40 MG PO TABS: ORAL_TABLET | ORAL | 3 refills | Status: AC

## 2023-10-12 MED ORDER — TRAMADOL HCL 50 MG PO TABS: 50.0000 mg | ORAL_TABLET | Freq: Four times a day (QID) | ORAL | 1 refills | Status: AC | PRN

## 2023-10-12 MED ORDER — ATORVASTATIN CALCIUM 40 MG PO TABS: 40.0000 mg | ORAL_TABLET | Freq: Every day | ORAL | 3 refills | Status: AC

## 2023-10-12 MED ORDER — AMLODIPINE BESYLATE 10 MG PO TABS: 10.0000 mg | ORAL_TABLET | Freq: Every day | ORAL | 3 refills | Status: AC

## 2023-10-12 NOTE — Progress Notes (Signed)
 Patient ID: Chad Murphy , male   DOB: [birth year], 63 y.o.   MRN: [id number]         Chief Complaint:: wellness exam and low vit d, ckd3a, htn, hyperglycemia       HPI:  Chad Murphy  is a 63 y.o. male here for wellness exam; up to date                   Also Pt denies chest pain, increased sob or doe, wheezing, orthopnea, PND, increased LE swelling, palpitations, dizziness or syncope.   Pt denies polydipsia, polyuria, or new focal neuro s/s.    Pt denies fever, wt loss, night sweats, loss of appetite, or other constitutional symptoms     Wt Readings from Last 3 Encounters:  10/12/23 234 lb (106.1 kg)  08/09/23 227 lb 15.3 oz (103.4 kg)  07/03/23 234 lb (106.1 kg)   BP Readings from Last 3 Encounters:  10/12/23 136/84  08/09/23 119/81  07/03/23 (!) 142/90   Immunization History  Administered Date(s) Administered   Influenza Split 04/18/2011, 03/13/2012   Influenza Whole 02/09/2010   Influenza,inj,Quad PF,6+ Mos 02/12/2014, 02/29/2016   Influenza-Unspecified 03/07/2020, 02/27/2021   PFIZER(Purple Top)SARS-COV-2 Vaccination 08/21/2019, 09/15/2019, 04/05/2020, 02/27/2021   Td 05/08/2006   Tdap 10/18/2020   Zoster Recombinant(Shingrix) 03/13/2022, 05/15/2022   Health Maintenance Due  Topic Date Due   Pneumococcal Vaccine 39-71 Years old (1 of 2 - PCV) Never done      Past Medical History:  Diagnosis Date   History of low back pain    Hyperlipidemia    Hypertension, essential    Scrotal lesion    Past Surgical History:  Procedure Laterality Date   EXCISION EPIDERMOID CYST, ABDOMINAL WALL  05/19/2003   KNEE ARTHROSCOPY Left 05/08/1998   LESION REMOVAL Right 08/09/2023   Procedure: EXCISION, LESION, TORSO;  Surgeon: Sim Dryer, MD;  Location: [facility];  Service: General;  Laterality: Right;  sebaceous cyst right buttocks   SCROTAL EXPLORATION N/A 12/17/2012   Procedure: EXCISION OF SCROTAL LESION;  Surgeon: Devorah Fonder, MD;  Location:  [facility] [place];  Service: Urology;  Laterality: N/A;   TOTAL HIP ARTHROPLASTY Left    WRIST GANGLION EXCISION Left 05/08/2000    reports that he quit smoking about 30 years ago. His smoking use included cigarettes. He started smoking about 36 years ago. He has a 3 pack-year smoking history. He has never used smokeless tobacco. He reports current alcohol use. He reports that he does not use drugs. family history includes Allergies in an other family member; Arthritis in an other family member; Cancer in his sister; Diabetes in an other family member; Hypertension in an other family member. No Known Allergies Current Outpatient Medications on File Prior to Visit  Medication Sig Dispense Refill   aspirin  EC 81 MG tablet Take 1 tablet (81 mg total) by mouth daily. 90 tablet 11   Cholecalciferol (THERA-D 2000) 50 MCG (2000 UT) TABS 1 tab by mouth once daily 30 tablet 99   Multiple Vitamin (MULTIVITAMIN) tablet Take 1 tablet by mouth daily.     naproxen  (NAPROSYN ) 500 MG tablet TAKE 1 TABLET BY MOUTH TWICE DAILY WITH A MEAL AS NEEDED FOR PAIN 60 tablet 3   oxyCODONE  (OXY IR/ROXICODONE ) 5 MG immediate release tablet Take 1 tablet (5 mg total) by mouth every 6 (six) hours as needed for severe pain (pain score 7-10). 15 tablet 0   Sod Picosulfate-Mag Ox-Cit Acd (CLENPIQ) 10-3.5-12 MG-GM -  GM/175ML SOLN 175 ML ORALLY TWICE for 2     triamcinolone  (NASACORT  AQ) 55 MCG/ACT AERO nasal inhaler Place 2 sprays into the nose daily. 1 each 12   No current facility-administered medications on file prior to visit.        ROS:  All others reviewed and negative.  Objective        PE:  BP 136/84 (BP Location: Left Arm, Patient Position: Sitting, Cuff Size: Normal)   Pulse 100   Temp 98.3 F (36.8 C) (Oral)   Ht 5\' 8"  (1.727 m)   Wt 234 lb (106.1 kg)   SpO2 97%   BMI 35.58 kg/m                 Constitutional: Pt appears in NAD               HENT: Head: NCAT.                Right Ear: External  ear normal.                 Left Ear: External ear normal.                Eyes: . Pupils are equal, round, and reactive to light. Conjunctivae and EOM are normal               Nose: without d/c or deformity               Neck: Neck supple. Gross normal ROM               Cardiovascular: Normal rate and regular rhythm.                 Pulmonary/Chest: Effort normal and breath sounds without rales or wheezing.                Abd:  Soft, NT, ND, + BS, no organomegaly               Neurological: Pt is alert. At baseline orientation, motor grossly intact               Skin: Skin is warm. No rashes, no other new lesions, LE edema - none               Psychiatric: Pt behavior is normal without agitation   Micro: none  Cardiac tracings I have personally interpreted today:  none  Pertinent Radiological findings (summarize): none   Lab Results  Component Value Date   WBC 5.5 10/09/2022   HGB 13.6 10/09/2022   HCT 40.8 10/09/2022   PLT 178.0 10/09/2022   GLUCOSE 107 (H) 10/09/2022   CHOL 132 10/09/2022   TRIG 103.0 10/09/2022   HDL 45.10 10/09/2022   LDLDIRECT 144.5 11/28/2007   LDLCALC 66 10/09/2022   ALT 28 10/09/2022   AST 21 10/09/2022   NA 140 10/09/2022   K 3.7 10/09/2022   CL 105 10/09/2022   CREATININE 1.50 10/09/2022   BUN 19 10/09/2022   CO2 27 10/09/2022   TSH 0.54 10/09/2022   PSA 2.84 10/09/2022   HGBA1C 5.8 10/09/2022   Assessment/Plan:  Chad Murphy  is a 63 y.o. Black or African American [2] male with  has a past medical history of History of low back pain, Hyperlipidemia, Hypertension, essential, and Scrotal lesion.  Encounter for well adult exam with abnormal findings Age and sex appropriate education and counseling updated with regular exercise and diet Referrals for preventative  services - none needed Immunizations addressed - none needed Smoking counseling  - none needed Evidence for depression or other mood disorder - none significant Most recent  labs reviewed. I have personally reviewed and have noted: 1) the patient's medical and social history 2) The patient's current medications and supplements 3) The patient's height, weight, and BMI have been recorded in the chart   CKD (chronic kidney disease) Lab Results  Component Value Date   CREATININE 1.50 10/09/2022   Stable overall, cont to avoid nephrotoxins   Hypertension BP Readings from Last 3 Encounters:  10/12/23 136/84  08/09/23 119/81  07/03/23 (!) 142/90   Stable, pt to continue medical treatment norvasc  10 every day, lisinopril  40 qd   Impaired glucose tolerance Lab Results  Component Value Date   HGBA1C 5.8 10/09/2022   Stable, pt to continue current medical treatment  - diet, wt control   Vitamin D  deficiency Last vitamin D  Lab Results  Component Value Date   VD25OH 35.48 10/09/2022   Low, to start oral replacement  Followup: Return in about 6 months (around 04/12/2024).  Rosalia Colonel, MD 10/12/2023 4:35 PM [facility]

## 2023-10-12 NOTE — Assessment & Plan Note (Signed)
 BP Readings from Last 3 Encounters:  10/12/23 136/84  08/09/23 119/81  07/03/23 (!) 142/90   Stable, pt to continue medical treatment norvasc  10 every day, lisinopril  40 qd

## 2023-10-12 NOTE — Assessment & Plan Note (Signed)
 Lab Results  Component Value Date   HGBA1C 5.8 10/09/2022   Stable, pt to continue current medical treatment  - diet, wt control

## 2023-10-12 NOTE — Assessment & Plan Note (Signed)

## 2023-10-12 NOTE — Assessment & Plan Note (Signed)
 Last vitamin D Lab Results  Component Value Date   VD25OH 35.48 10/09/2022   Low, to start oral replacement

## 2023-10-12 NOTE — Assessment & Plan Note (Signed)
 Lab Results  Component Value Date   CREATININE 1.50 10/09/2022   Stable overall, cont to avoid nephrotoxins

## 2023-10-12 NOTE — Patient Instructions (Addendum)
 Please continue all other medications as before, and refills have been done if requested.  Please have the pharmacy call with any other refills you may need.  Please continue your efforts at being more active, low cholesterol diet, and weight control.  You are otherwise up to date with prevention measures today.  Please keep your appointments with your specialists as you may have planned  Please go to the LAB at the blood drawing area for the tests to be done - at the [facility]  You will be contacted by phone if any changes need to be made immediately.  Otherwise, you will receive a letter about your results with an explanation, but please check with MyChart first.  Please make an Appointment to return in 6 months, or sooner if needed

## 2023-10-18 ENCOUNTER — Ambulatory Visit: Payer: Self-pay | Admitting: Internal Medicine

## 2023-10-18 LAB — VITAMIN B12: Vitamin B-12: 644 pg/mL (ref 211–911)

## 2023-10-18 LAB — TSH: TSH: 0.67 u[IU]/mL (ref 0.35–5.50)

## 2023-10-18 LAB — PSA: PSA: 2.12 ng/mL (ref 0.10–4.00)

## 2023-10-18 LAB — VITAMIN D 25 HYDROXY (VIT D DEFICIENCY, FRACTURES): VITD: 48.93 ng/mL (ref 30.00–100.00)

## 2023-10-18 NOTE — Progress Notes (Signed)
 The test results show that your current treatment is OK, as the tests are stable.  Please continue the same plan.  There is no other need for change of treatment or further evaluation based on these results, at this time.  thanks

## 2023-10-22 ENCOUNTER — Encounter (INDEPENDENT_AMBULATORY_CARE_PROVIDER_SITE_OTHER): Payer: BC Managed Care – PPO | Admitting: Ophthalmology

## 2023-10-22 DIAGNOSIS — H43813 Vitreous degeneration, bilateral: Secondary | ICD-10-CM

## 2023-10-22 DIAGNOSIS — H353131 Nonexudative age-related macular degeneration, bilateral, early dry stage: Secondary | ICD-10-CM

## 2023-10-22 DIAGNOSIS — H35033 Hypertensive retinopathy, bilateral: Secondary | ICD-10-CM | POA: Diagnosis not present

## 2023-10-22 DIAGNOSIS — I10 Essential (primary) hypertension: Secondary | ICD-10-CM

## 2023-10-22 DIAGNOSIS — H2513 Age-related nuclear cataract, bilateral: Secondary | ICD-10-CM

## 2024-04-18 ENCOUNTER — Ambulatory Visit: Payer: Self-pay | Admitting: Internal Medicine
